# Patient Record
Sex: Male | Born: 1985 | Race: Black or African American | Hispanic: No | Marital: Single | Smoking: Current every day smoker
Health system: Southern US, Community
[De-identification: ages and names within clinical notes are randomized; demographics above are authoritative.]

## PROBLEM LIST (undated history)

## (undated) DIAGNOSIS — S62309A Unspecified fracture of unspecified metacarpal bone, initial encounter for closed fracture: Secondary | ICD-10-CM

## (undated) HISTORY — PX: NO PAST SURGERIES: SHX2092

---
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
## 2006-10-23 ENCOUNTER — Emergency Department (HOSPITAL_COMMUNITY): Admission: EM | Admit: 2006-10-23 | Discharge: 2006-10-23 | Payer: Self-pay | Admitting: *Deleted

## 2012-05-13 ENCOUNTER — Emergency Department: Payer: Self-pay | Admitting: *Deleted

## 2015-02-09 ENCOUNTER — Emergency Department: Payer: Self-pay | Admitting: Emergency Medicine

## 2015-04-11 ENCOUNTER — Encounter (HOSPITAL_COMMUNITY): Payer: Self-pay | Admitting: Emergency Medicine

## 2015-04-11 ENCOUNTER — Emergency Department (HOSPITAL_COMMUNITY)

## 2015-04-11 ENCOUNTER — Emergency Department (HOSPITAL_COMMUNITY)
Admission: EM | Admit: 2015-04-11 | Discharge: 2015-04-11 | Disposition: A | Discharge status: Home / Self Care | Attending: Emergency Medicine | Admitting: Emergency Medicine

## 2015-04-11 DIAGNOSIS — Y939 Activity, unspecified: Secondary | ICD-10-CM | POA: Diagnosis not present

## 2015-04-11 DIAGNOSIS — X58XXXA Exposure to other specified factors, initial encounter: Secondary | ICD-10-CM | POA: Diagnosis not present

## 2015-04-11 DIAGNOSIS — Y929 Unspecified place or not applicable: Secondary | ICD-10-CM | POA: Insufficient documentation

## 2015-04-11 DIAGNOSIS — S6991XA Unspecified injury of right wrist, hand and finger(s), initial encounter: Secondary | ICD-10-CM | POA: Diagnosis not present

## 2015-04-11 DIAGNOSIS — Y999 Unspecified external cause status: Secondary | ICD-10-CM | POA: Insufficient documentation

## 2015-04-11 DIAGNOSIS — M79641 Pain in right hand: Secondary | ICD-10-CM

## 2015-04-11 DIAGNOSIS — Z87891 Personal history of nicotine dependence: Secondary | ICD-10-CM | POA: Diagnosis not present

## 2015-04-11 MED ORDER — ACETAMINOPHEN-CODEINE #3 300-30 MG PO TABS
1.0000 | ORAL_TABLET | Freq: Once | ORAL | Status: AC
  Administered 2015-04-11: 1 via ORAL
  Filled 2015-04-11: qty 1
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
## 2015-04-11 NOTE — Discharge Instructions (Signed)
X-ray today showed old fracture with improper healing, no new injuries noted today. Follow-up with hand surgeon-- call to make appt. Return here as needed for new concerns.

## 2016-05-12 ENCOUNTER — Emergency Department (HOSPITAL_COMMUNITY): Payer: Self-pay

## 2016-05-12 ENCOUNTER — Encounter (HOSPITAL_COMMUNITY): Payer: Self-pay | Admitting: Emergency Medicine

## 2016-05-12 ENCOUNTER — Emergency Department (HOSPITAL_COMMUNITY)
Admission: EM | Admit: 2016-05-12 | Discharge: 2016-05-12 | Disposition: A | Discharge status: Home / Self Care | Payer: Self-pay | Attending: Emergency Medicine | Admitting: Emergency Medicine

## 2016-05-12 DIAGNOSIS — Y999 Unspecified external cause status: Secondary | ICD-10-CM | POA: Insufficient documentation

## 2016-05-12 DIAGNOSIS — Y939 Activity, unspecified: Secondary | ICD-10-CM | POA: Insufficient documentation

## 2016-05-12 DIAGNOSIS — Z87891 Personal history of nicotine dependence: Secondary | ICD-10-CM | POA: Insufficient documentation

## 2016-05-12 DIAGNOSIS — Y92149 Unspecified place in prison as the place of occurrence of the external cause: Secondary | ICD-10-CM | POA: Insufficient documentation

## 2016-05-12 DIAGNOSIS — S62309A Unspecified fracture of unspecified metacarpal bone, initial encounter for closed fracture: Secondary | ICD-10-CM

## 2016-05-12 DIAGNOSIS — S62306A Unspecified fracture of fifth metacarpal bone, right hand, initial encounter for closed fracture: Secondary | ICD-10-CM | POA: Insufficient documentation

## 2016-05-12 HISTORY — DX: Unspecified fracture of unspecified metacarpal bone, initial encounter for closed fracture: S62.309A

## 2016-05-12 MED ORDER — IBUPROFEN 800 MG PO TABS
800.0000 mg | ORAL_TABLET | Freq: Once | ORAL | Status: AC
  Administered 2016-05-12: 800 mg via ORAL
  Filled 2016-05-12: qty 1
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
## 2016-05-12 NOTE — Discharge Instructions (Signed)
Schedule an appointment with the orthopedic hand surgeon, Dr. Merlyn LotKuzma, or the orthopedic surgeon designated by your correctional facility. Continue to ice the hand and use ibuprofen or tylenol for pain control.    Metacarpal Fracture A metacarpal fracture is a break (fracture) of a bone in the hand. Metacarpals are the bones that extend from your knuckles to your wrist. In each hand, you have five metacarpal bones that connect your fingers and your thumb to your wrist. Some hand fractures have bone pieces that are close together and stable (simple). These fractures may be treated with only a splint or cast. Hand fractures that have many pieces of broken bone (comminuted), unstable bone pieces (displaced), or a bone that breaks through the skin (compound) usually require surgery. CAUSES This injury may be caused by:  A fall.  A hard, direct hit to your hand.  An injury that squeezes your knuckle, stretches your finger out of place, or crushes your hand. RISK FACTORS This injury is more likely to occur if:  You play contact sports.  You have certain bone diseases. SYMPTOMS  Symptoms of this type of fracture develop soon after the injury. Symptoms may include:  Swelling.  Pain.  Stiffness.  Increased pain with movement.  Bruising.  Inability to move a finger.  A shortened finger.  A finger knuckle that looks sunken in.  Unusual appearance of the hand or finger (deformity). DIAGNOSIS  This injury may be diagnosed based on your signs and symptoms, especially if you had a recent hand injury. Your health care provider will perform a physical exam. He or she may also order X-rays to confirm the diagnosis.  TREATMENT  Treatment for this injury depends on the type of fracture you have and how severe it is. Possible treatments include:  Non-reduction. This can be done if the bone does not need to be moved back into place. The fracture can be casted or splinted as it is.   Closed  reduction. If your bone is stable and can be moved back into place, you may only need to wear a cast or splint or have buddy taping.  Closed reduction with internal fixation (CRIF). This is the most common treatment. You may have this procedure if your bone can be moved back into place but needs more support. Wires, pins, or screws may be inserted through your skin to stabilize the fracture.  Open reduction with internal fixation (ORIF). This may be needed if your fracture is severe and unstable. It involves surgery to move your bone back into the right position. Screws, wires, or plates are used to stabilize the fracture. After all procedures, you may need to wear a cast or a splint for several weeks. You will also need to have follow-up X-rays to make sure that the bone is healing well and staying in position. After you no longer need your cast or splint, you may need physical therapy. This will help you to regain full movement and strength in your hand.  HOME CARE INSTRUCTIONS  If You Have a Cast:  Do not stick anything inside the cast to scratch your skin. Doing that increases your risk of infection.  Check the skin around the cast every day. Report any concerns to your health care provider. You may put lotion on dry skin around the edges of the cast. Do not apply lotion to the skin underneath the cast. If You Have a Splint:  Wear it as directed by your health care provider. Remove it  only as directed by your health care provider.  Loosen the splint if your fingers become numb and tingle, or if they turn cold and blue. Bathing  Cover the cast or splint with a watertight plastic bag to protect it from water while you take a bath or a shower. Do not let the cast or splint get wet. Managing Pain, Stiffness, and Swelling  If directed, apply ice to the injured area (if you have a splint, not a cast):  Put ice in a plastic bag.  Place a towel between your skin and the bag.  Leave the ice on  for 20 minutes, 2-3 times a day.  Move your fingers often to avoid stiffness and to lessen swelling.  Raise the injured area above the level of your heart while you are sitting or lying down. Driving  Do not drive or operate heavy machinery while taking pain medicine.  Do not drive while wearing a cast or splint on a hand that you use for driving. Activity  Return to your normal activities as directed by your health care provider. Ask your health care provider what activities are safe for you. General Instructions  Do not put pressure on any part of the cast or splint until it is fully hardened. This may take several hours.  Keep the cast or splint clean and dry.  Do not use any tobacco products, including cigarettes, chewing tobacco, or electronic cigarettes. Tobacco can delay bone healing. If you need help quitting, ask your health care provider.  Take medicines only as directed by your health care provider.  Keep all follow-up visits as directed by your health care provider. This is important. SEEK MEDICAL CARE IF:   Your pain is getting worse.  You have redness, swelling, or pain in the injured area.   You have fluid, blood, or pus coming from under your cast or splint.   You notice a bad smell coming from under your cast or splint.   You have a fever.  SEEK IMMEDIATE MEDICAL CARE IF:   You develop a rash.   You have trouble breathing.   Your skin or nails on your injured hand turn blue or gray even after you loosen your splint.  Your injured hand feels cold or becomes numb even after you loosen your splint.   You develop severe pain under the cast or in your hand.   This information is not intended to replace advice given to you by your health care provider. Make sure you discuss any questions you have with your health care provider.   Document Released: 12/03/2005 Document Revised: 08/24/2015 Document Reviewed: 09/22/2014 Elsevier Interactive Patient  Education [employer]
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
## 2016-05-15 ENCOUNTER — Telehealth (HOSPITAL_BASED_OUTPATIENT_CLINIC_OR_DEPARTMENT_OTHER): Payer: Self-pay | Admitting: Emergency Medicine

## 2016-05-22 ENCOUNTER — Other Ambulatory Visit: Payer: Self-pay | Admitting: Orthopedic Surgery

## 2016-05-24 ENCOUNTER — Encounter (HOSPITAL_BASED_OUTPATIENT_CLINIC_OR_DEPARTMENT_OTHER): Payer: Self-pay | Admitting: *Deleted

## 2016-05-24 HISTORY — DX: Imprisonment and other incarceration: Z65.1

## 2016-05-31 ENCOUNTER — Ambulatory Visit (HOSPITAL_BASED_OUTPATIENT_CLINIC_OR_DEPARTMENT_OTHER): Admitting: Certified Registered"

## 2016-05-31 ENCOUNTER — Encounter (HOSPITAL_BASED_OUTPATIENT_CLINIC_OR_DEPARTMENT_OTHER): Payer: Self-pay | Admitting: *Deleted

## 2016-05-31 ENCOUNTER — Ambulatory Visit (HOSPITAL_BASED_OUTPATIENT_CLINIC_OR_DEPARTMENT_OTHER)
Admission: RE | Admit: 2016-05-31 | Discharge: 2016-05-31 | Disposition: A | Discharge status: Home / Self Care | Source: Ambulatory Visit | Attending: Orthopedic Surgery | Admitting: Orthopedic Surgery

## 2016-05-31 ENCOUNTER — Encounter (HOSPITAL_BASED_OUTPATIENT_CLINIC_OR_DEPARTMENT_OTHER)
Admission: RE | Disposition: A | Discharge status: Home / Self Care | Payer: Self-pay | Source: Ambulatory Visit | Attending: Orthopedic Surgery

## 2016-05-31 DIAGNOSIS — F17210 Nicotine dependence, cigarettes, uncomplicated: Secondary | ICD-10-CM | POA: Insufficient documentation

## 2016-05-31 DIAGNOSIS — S62326A Displaced fracture of shaft of fifth metacarpal bone, right hand, initial encounter for closed fracture: Secondary | ICD-10-CM | POA: Insufficient documentation

## 2016-05-31 DIAGNOSIS — X58XXXA Exposure to other specified factors, initial encounter: Secondary | ICD-10-CM | POA: Insufficient documentation

## 2016-05-31 DIAGNOSIS — S62306A Unspecified fracture of fifth metacarpal bone, right hand, initial encounter for closed fracture: Secondary | ICD-10-CM | POA: Diagnosis present

## 2016-05-31 HISTORY — DX: Imprisonment and other incarceration: Z65.1

## 2016-05-31 HISTORY — PX: OPEN REDUCTION INTERNAL FIXATION (ORIF) FINGER WITH RADIAL BONE GRAFT: SHX5666

## 2016-05-31 HISTORY — DX: Unspecified fracture of unspecified metacarpal bone, initial encounter for closed fracture: S62.309A

## 2016-05-31 SURGERY — OPEN REDUCTION INTERNAL FIXATION (ORIF) FINGER WITH RADIAL BONE GRAFT
Anesthesia: Regional | Site: Hand | Laterality: Right

## 2016-05-31 MED ORDER — OXYCODONE HCL 5 MG/5ML PO SOLN: 5.0000 mg | Freq: Once | ORAL | Status: DC | PRN

## 2016-05-31 MED ORDER — ONDANSETRON HCL 4 MG/2ML IJ SOLN
INTRAMUSCULAR | Status: DC | PRN
  Administered 2016-05-31: 4 mg via INTRAVENOUS

## 2016-05-31 MED ORDER — MIDAZOLAM HCL 2 MG/2ML IJ SOLN
1.0000 mg | INTRAMUSCULAR | Status: DC | PRN
  Administered 2016-05-31: 2 mg via INTRAVENOUS

## 2016-05-31 MED ORDER — PROPOFOL 10 MG/ML IV BOLUS
INTRAVENOUS | Status: DC | PRN
  Administered 2016-05-31: 200 mg via INTRAVENOUS

## 2016-05-31 MED ORDER — SCOPOLAMINE 1 MG/3DAYS TD PT72: 1.0000 | MEDICATED_PATCH | Freq: Once | TRANSDERMAL | Status: DC | PRN

## 2016-05-31 MED ORDER — ALBUTEROL SULFATE HFA 108 (90 BASE) MCG/ACT IN AERS
INHALATION_SPRAY | RESPIRATORY_TRACT | Status: AC
  Filled 2016-05-31: qty 6.7

## 2016-05-31 MED ORDER — CEFAZOLIN SODIUM-DEXTROSE 2-4 GM/100ML-% IV SOLN
2.0000 g | INTRAVENOUS | Status: AC
Start: 2016-06-01 — End: 2016-05-31
  Administered 2016-05-31: 2 g via INTRAVENOUS

## 2016-05-31 MED ORDER — LIDOCAINE 2% (20 MG/ML) 5 ML SYRINGE
INTRAMUSCULAR | Status: AC
  Filled 2016-05-31: qty 5

## 2016-05-31 MED ORDER — OXYCODONE HCL 5 MG PO TABS: 5.0000 mg | ORAL_TABLET | Freq: Once | ORAL | Status: DC | PRN

## 2016-05-31 MED ORDER — LIDOCAINE 2% (20 MG/ML) 5 ML SYRINGE
INTRAMUSCULAR | Status: DC | PRN
  Administered 2016-05-31: 100 mg via INTRAVENOUS

## 2016-05-31 MED ORDER — BUPIVACAINE-EPINEPHRINE (PF) 0.5% -1:200000 IJ SOLN
INTRAMUSCULAR | Status: DC | PRN
  Administered 2016-05-31: 30 mL via PERINEURAL

## 2016-05-31 MED ORDER — OXYCODONE-ACETAMINOPHEN 5-325 MG PO TABS: ORAL_TABLET | ORAL | Status: DC

## 2016-05-31 MED ORDER — FENTANYL CITRATE (PF) 100 MCG/2ML IJ SOLN
50.0000 ug | INTRAMUSCULAR | Status: DC | PRN
  Administered 2016-05-31: 100 ug via INTRAVENOUS

## 2016-05-31 MED ORDER — PROPOFOL 10 MG/ML IV BOLUS
INTRAVENOUS | Status: AC
  Filled 2016-05-31: qty 20

## 2016-05-31 MED ORDER — CEFAZOLIN SODIUM-DEXTROSE 2-4 GM/100ML-% IV SOLN
INTRAVENOUS | Status: AC
  Filled 2016-05-31: qty 100

## 2016-05-31 MED ORDER — BUPIVACAINE-EPINEPHRINE (PF) 0.5% -1:200000 IJ SOLN
INTRAMUSCULAR | Status: AC
  Filled 2016-05-31: qty 30

## 2016-05-31 MED ORDER — DEXAMETHASONE SODIUM PHOSPHATE 10 MG/ML IJ SOLN
INTRAMUSCULAR | Status: AC
  Filled 2016-05-31: qty 1

## 2016-05-31 MED ORDER — MEPERIDINE HCL 25 MG/ML IJ SOLN: 6.2500 mg | INTRAMUSCULAR | Status: DC | PRN

## 2016-05-31 MED ORDER — FENTANYL CITRATE (PF) 100 MCG/2ML IJ SOLN
INTRAMUSCULAR | Status: AC
  Filled 2016-05-31: qty 2

## 2016-05-31 MED ORDER — GLYCOPYRROLATE 0.2 MG/ML IJ SOLN: 0.2000 mg | Freq: Once | INTRAMUSCULAR | Status: DC | PRN

## 2016-05-31 MED ORDER — DEXAMETHASONE SODIUM PHOSPHATE 10 MG/ML IJ SOLN
INTRAMUSCULAR | Status: DC | PRN
  Administered 2016-05-31: 10 mg via INTRAVENOUS

## 2016-05-31 MED ORDER — HYDROMORPHONE HCL 1 MG/ML IJ SOLN: 0.2500 mg | INTRAMUSCULAR | Status: DC | PRN

## 2016-05-31 MED ORDER — LACTATED RINGERS IV SOLN
INTRAVENOUS | Status: DC
  Administered 2016-05-31 (×2): via INTRAVENOUS

## 2016-05-31 MED ORDER — CHLORHEXIDINE GLUCONATE 4 % EX LIQD: 60.0000 mL | Freq: Once | CUTANEOUS | Status: DC

## 2016-05-31 MED ORDER — ONDANSETRON HCL 4 MG/2ML IJ SOLN
INTRAMUSCULAR | Status: AC
  Filled 2016-05-31: qty 2

## 2016-05-31 MED ORDER — MIDAZOLAM HCL 2 MG/2ML IJ SOLN
INTRAMUSCULAR | Status: AC
  Filled 2016-05-31: qty 2

## 2016-05-31 SURGICAL SUPPLY — 65 items
BANDAGE ACE 3X5.8 VEL STRL LF (GAUZE/BANDAGES/DRESSINGS) ×2 IMPLANT
BIT DRILL 1.1 (BIT) ×2
BIT DRILL 1.1MM (BIT) ×1
BIT DRILL 60X20X1.1XQC TMX (BIT) IMPLANT
BIT DRL 60X20X1.1XQC TMX (BIT) ×1
BLADE MINI RND TIP GREEN BEAV (BLADE) ×2 IMPLANT
BLADE SURG 15 STRL LF DISP TIS (BLADE) ×2 IMPLANT
BLADE SURG 15 STRL SS (BLADE) ×6
BNDG CMPR 9X4 STRL LF SNTH (GAUZE/BANDAGES/DRESSINGS) ×1
BNDG ELASTIC 2X5.8 VLCR STR LF (GAUZE/BANDAGES/DRESSINGS) IMPLANT
BNDG ESMARK 4X9 LF (GAUZE/BANDAGES/DRESSINGS) ×2 IMPLANT
BNDG GAUZE ELAST 4 BULKY (GAUZE/BANDAGES/DRESSINGS) ×3 IMPLANT
CHLORAPREP W/TINT 26ML (MISCELLANEOUS) ×3 IMPLANT
CORDS BIPOLAR (ELECTRODE) ×3 IMPLANT
COVER BACK TABLE 60X90IN (DRAPES) ×3 IMPLANT
COVER MAYO STAND STRL (DRAPES) ×3 IMPLANT
CUFF TOURNIQUET SINGLE 18IN (TOURNIQUET CUFF) ×3 IMPLANT
DRAPE EXTREMITY T 121X128X90 (DRAPE) ×3 IMPLANT
DRAPE OEC MINIVIEW 54X84 (DRAPES) ×2 IMPLANT
DRAPE SURG 17X23 STRL (DRAPES) ×3 IMPLANT
GAUZE SPONGE 4X4 12PLY STRL (GAUZE/BANDAGES/DRESSINGS) ×3 IMPLANT
GAUZE XEROFORM 1X8 LF (GAUZE/BANDAGES/DRESSINGS) ×3 IMPLANT
GLOVE BIO SURGEON STRL SZ7.5 (GLOVE) ×3 IMPLANT
GLOVE BIOGEL PI IND STRL 8 (GLOVE) ×1 IMPLANT
GLOVE BIOGEL PI INDICATOR 8 (GLOVE) ×4
GLOVE SURG SS PI 7.5 STRL IVOR (GLOVE) ×2 IMPLANT
GOWN STRL REUS W/ TWL LRG LVL3 (GOWN DISPOSABLE) ×1 IMPLANT
GOWN STRL REUS W/ TWL XL LVL3 (GOWN DISPOSABLE) ×1 IMPLANT
GOWN STRL REUS W/TWL LRG LVL3 (GOWN DISPOSABLE) ×3
GOWN STRL REUS W/TWL XL LVL3 (GOWN DISPOSABLE) ×9
K-WIRE .045X6 DBL TRO NS (WIRE) ×3
K-WIRE DBL TRONS .035X6 (WIRE) ×3
KWIRE .045X6 DBL TRO NS (WIRE) IMPLANT
KWIRE DBL TRONS .035X6 (WIRE) IMPLANT
NDL HYPO 25X1 1.5 SAFETY (NEEDLE) IMPLANT
NEEDLE HYPO 22GX1.5 SAFETY (NEEDLE) IMPLANT
NEEDLE HYPO 25X1 1.5 SAFETY (NEEDLE) IMPLANT
NS IRRIG 1000ML POUR BTL (IV SOLUTION) ×3 IMPLANT
PACK BASIN DAY SURGERY FS (CUSTOM PROCEDURE TRAY) ×3 IMPLANT
PAD CAST 3X4 CTTN HI CHSV (CAST SUPPLIES) IMPLANT
PAD CAST 4YDX4 CTTN HI CHSV (CAST SUPPLIES) IMPLANT
PADDING CAST ABS 4INX4YD NS (CAST SUPPLIES) ×2
PADDING CAST ABS COTTON 4X4 ST (CAST SUPPLIES) ×1 IMPLANT
PADDING CAST COTTON 3X4 STRL (CAST SUPPLIES)
PADDING CAST COTTON 4X4 STRL (CAST SUPPLIES)
PLATE STRAIGHT LOCK 1.5 (Plate) ×2 IMPLANT
SCREW 1.5X15MM (Screw) ×4 IMPLANT
SCREW 1.5X18MM (Screw) ×9 IMPLANT
SCREW BN 18X1.5XST NONLOCK (Screw) IMPLANT
SCREW LOCKING 1.5X10 (Screw) ×2 IMPLANT
SCREW NONIOC 1.5 14M (Screw) ×4 IMPLANT
SLEEVE SCD COMPRESS KNEE MED (MISCELLANEOUS) ×2 IMPLANT
SPLINT PLASTER CAST XFAST 4X15 (CAST SUPPLIES) IMPLANT
SPLINT PLASTER XTRA FAST SET 4 (CAST SUPPLIES)
STOCKINETTE 4X48 STRL (DRAPES) ×3 IMPLANT
SUT ETHILON 3 0 PS 1 (SUTURE) IMPLANT
SUT ETHILON 4 0 PS 2 18 (SUTURE) ×3 IMPLANT
SUT MERSILENE 4 0 P 3 (SUTURE) IMPLANT
SUT VIC AB 3-0 PS1 18 (SUTURE)
SUT VIC AB 3-0 PS1 18XBRD (SUTURE) IMPLANT
SUT VICRYL 4-0 PS2 18IN ABS (SUTURE) IMPLANT
SYR BULB 3OZ (MISCELLANEOUS) ×3 IMPLANT
SYR CONTROL 10ML LL (SYRINGE) IMPLANT
TOWEL OR 17X24 6PK STRL BLUE (TOWEL DISPOSABLE) ×6 IMPLANT
UNDERPAD 30X30 (UNDERPADS AND DIAPERS) ×3 IMPLANT

## 2016-05-31 NOTE — Discharge Instructions (Addendum)
[facility] Instructions Hand Surgery  Wound Care: Keep your hand elevated above the level of your heart.  Do not allow it to dangle by your side.  Keep the dressing dry and do not remove it unless your doctor advises you to do so.  He will usually change it at the time of your post-op visit.  Moving your fingers is advised to stimulate circulation but will depend on the site of your surgery.  If you have a splint applied, your doctor will advise you regarding movement.  Activity: Do not drive or operate machinery today.  Rest today and then you may return to your normal activity and work as indicated by your physician.  Diet:  Drink liquids today or eat a light diet.  You may resume a regular diet tomorrow.    General expectations: Pain for two to three days. Fingers may become slightly swollen.  Call your doctor if any of the following occur: Severe pain not relieved by pain medication. Elevated temperature. Dressing soaked with blood. Inability to move fingers. White or bluish color to fingers.    Post Anesthesia Home Care Instructions  Activity: Get plenty of rest for the remainder of the day. A responsible adult should stay with you for 24 hours following the procedure.  For the next 24 hours, DO NOT: -Drive a car -Advertising copywriter -Drink alcoholic beverages -Take any medication unless instructed by your physician -Make any legal decisions or sign important papers.  Meals: Start with liquid foods such as gelatin or soup. Progress to regular foods as tolerated. Avoid greasy, spicy, heavy foods. If nausea and/or vomiting occur, drink only clear liquids until the nausea and/or vomiting subsides. Call your physician if vomiting continues.  Special Instructions/Symptoms: Your throat may feel dry or sore from the anesthesia or the breathing tube placed in your throat during surgery. If this causes discomfort, gargle with warm salt water. The discomfort should disappear within  24 hours.  If you had a scopolamine patch placed behind your ear for the management of post- operative nausea and/or vomiting:  1. The medication in the patch is effective for 72 hours, after which it should be removed.  Wrap patch in a tissue and discard in the trash. Wash hands thoroughly with soap and water. 2. You may remove the patch earlier than 72 hours if you experience unpleasant side effects which may include dry mouth, dizziness or visual disturbances. 3. Avoid touching the patch. Wash your hands with soap and water after contact with the patch.    Call your surgeon if you experience:   1.  Fever over 101.0. 2.  Inability to urinate. 3.  Nausea and/or vomiting. 4.  Extreme swelling or bruising at the surgical site. 5.  Continued bleeding from the incision. 6.  Increased pain, redness or drainage from the incision. 7.  Problems related to your pain medication. 8.  Any problems and/or concerns    Post Anesthesia Home Care Instructions  Activity: Get plenty of rest for the remainder of the day. A responsible adult should stay with you for 24 hours following the procedure.  For the next 24 hours, DO NOT: -Drive a car -Advertising copywriter -Drink alcoholic beverages -Take any medication unless instructed by your physician -Make any legal decisions or sign important papers.  Meals: Start with liquid foods such as gelatin or soup. Progress to regular foods as tolerated. Avoid greasy, spicy, heavy foods. If nausea and/or vomiting occur, drink only clear liquids until the nausea and/or  vomiting subsides. Call your physician if vomiting continues.  Special Instructions/Symptoms: Your throat may feel dry or sore from the anesthesia or the breathing tube placed in your throat during surgery. If this causes discomfort, gargle with warm salt water. The discomfort should disappear within 24 hours.  If you had a scopolamine patch placed behind your ear for the management of post-  operative nausea and/or vomiting:  1. The medication in the patch is effective for 72 hours, after which it should be removed.  Wrap patch in a tissue and discard in the trash. Wash hands thoroughly with soap and water. 2. You may remove the patch earlier than 72 hours if you experience unpleasant side effects which may include dry mouth, dizziness or visual disturbances. 3. Avoid touching the patch. Wash your hands with soap and water after contact with the patch.    Regional Anesthesia Blocks  1. Numbness or the inability to move the "blocked" extremity may last from 3-48 hours after placement. The length of time depends on the medication injected and your individual response to the medication. If the numbness is not going away after 48 hours, call your surgeon.  2. The extremity that is blocked will need to be protected until the numbness is gone and the  Strength has returned. Because you cannot feel it, you will need to take extra care to avoid injury. Because it may be weak, you may have difficulty moving it or using it. You may not know what position it is in without looking at it while the block is in effect.  3. For blocks in the legs and feet, returning to weight bearing and walking needs to be done carefully. You will need to wait until the numbness is entirely gone and the strength has returned. You should be able to move your leg and foot normally before you try and bear weight or walk. You will need someone to be with you when you first try to ensure you do not fall and possibly risk injury.  4. Bruising and tenderness at the needle site are common side effects and will resolve in a few days.  5. Persistent numbness or new problems with movement should be communicated to the surgeon or the [facility] [phone])/ [facility] [phone]).  Call your surgeon if you experience:   1.  Fever over 101.0. 2.  Inability to urinate. 3.  Nausea and/or  vomiting. 4.  Extreme swelling or bruising at the surgical site. 5.  Continued bleeding from the incision. 6.  Increased pain, redness or drainage from the incision. 7.  Problems related to your pain medication. 8.  Any problems and/or concerns

## 2016-05-31 NOTE — Op Note (Signed)
[id number] 

## 2016-05-31 NOTE — Anesthesia Procedure Notes (Addendum)
Anesthesia Regional Block:  Infraclavicular brachial plexus block  Pre-Anesthetic Checklist: ,, timeout performed, Correct Patient, Correct Site, Correct Laterality, Correct Procedure, Correct Position, site marked, Risks and benefits discussed,  Surgical consent,  Pre-op evaluation,  At surgeon's request and post-op pain management  Laterality: Right and Upper  Prep: chloraprep       Needles:  Injection technique: Single-shot  Needle Type: Echogenic Stimulator Needle     Needle Length: 5cm 5 cm Needle Gauge: 21 and 21 G    Additional Needles:  Procedures: ultrasound guided (picture in chart) Infraclavicular brachial plexus block Narrative:  Start time: 05/31/2016 12:54 PM End time: 05/31/2016 12:59 PM Injection made incrementally with aspirations every 5 mL.  Performed by: Personally  Anesthesiologist: CREWS, DAVID   Procedure Name: LMA Insertion Date/Time: 05/31/2016 2:20 PM Performed by: Curly ShoresRAFT, Hosea Hanawalt W Pre-anesthesia Checklist: Patient identified, Emergency Drugs available, Suction available and Patient being monitored Patient Re-evaluated:Patient Re-evaluated prior to inductionOxygen Delivery Method: Circle system utilized Preoxygenation: Pre-oxygenation with 100% oxygen Intubation Type: IV induction Ventilation: Mask ventilation without difficulty LMA: LMA inserted LMA Size: 4.0 Number of attempts: 1 Airway Equipment and Method: Bite block Placement Confirmation: positive ETCO2 and breath sounds checked- equal and bilateral Tube secured with: Tape Dental Injury: Teeth and Oropharynx as per pre-operative assessment       Right Infraclavicular block image

## 2016-05-31 NOTE — Transfer of Care (Signed)
Immediate Anesthesia Transfer of Care Note  Patient: Jeffrey Mccoy  Procedure(s) Performed: Procedure(s): RIGHT SMALL METACARPAL FRACTURE OPEN REDUCTION INTERNAL FIXATION (ORIF) DISTAL RADIUS BONE GRAFT  (Right)  Patient Location: PACU  Anesthesia Type:GA combined with regional for post-op pain  Level of Consciousness: awake, sedated and responds to stimulation  Airway & Oxygen Therapy: Patient Spontanous Breathing and Patient connected to face mask oxygen  Post-op Assessment: Report given to RN, Post -op Vital signs reviewed and stable and Patient moving all extremities  Post vital signs: Reviewed and stable  Last Vitals:  Filed Vitals:   05/31/16 1310 05/31/16 1606  BP:    Pulse: 76 74  Temp:    Resp: 17 20    Last Pain:  Filed Vitals:   05/31/16 1607  PainSc: 2          Complications: No apparent anesthesia complications

## 2016-05-31 NOTE — Brief Op Note (Signed)
05/31/2016  4:11 PM  PATIENT:  Gevena Martharles D Lentz  30 y.o. male  PRE-OPERATIVE DIAGNOSIS:  RIGHT SMALL FINGER METACARPAL FRACTURE   POST-OPERATIVE DIAGNOSIS:  RIGHT SMALL FINGER METACARPAL FRACTURE   PROCEDURE:  Procedure(s): RIGHT SMALL METACARPAL FRACTURE OPEN REDUCTION INTERNAL FIXATION (ORIF) DISTAL RADIUS BONE GRAFT  (Right)  SURGEON:  Surgeon(s) and Role:    * Betha LoaKevin Shenita Trego, MD - Primary    * Cindee SaltGary Mohini Heathcock, MD - Assisting  PHYSICIAN ASSISTANT:   ASSISTANTS: Cindee SaltGary Aysia Lowder, MD   ANESTHESIA:   regional and general  EBL:  Total I/O In: 1600 [I.V.:1600] Out: 2 [Blood:2]  BLOOD ADMINISTERED:none  DRAINS: none   LOCAL MEDICATIONS USED:  NONE  SPECIMEN:  No Specimen  DISPOSITION OF SPECIMEN:  N/A  COUNTS:  YES  TOURNIQUET:   Total Tourniquet Time Documented: Upper Arm (Right) - 88 minutes Total: Upper Arm (Right) - 88 minutes   DICTATION: .Other Dictation: Dictation Number [id number]  PLAN OF CARE: Discharge to home after PACU  PATIENT DISPOSITION:  PACU - hemodynamically stable.

## 2016-05-31 NOTE — Op Note (Signed)
Intra-operative fluoroscopic images in the AP, lateral, and oblique views were taken and evaluated by myself.  Reduction and hardware placement were confirmed.  There was no intraarticular penetration of permanent hardware.  

## 2016-05-31 NOTE — Progress Notes (Signed)
Assisted Dr. Crews with right, ultrasound guided, infraclavicular block. Side rails up, monitors on throughout procedure. See vital signs in flow sheet. Tolerated Procedure well. 

## 2016-05-31 NOTE — Op Note (Signed)
I assisted Surgeon(s) and Role:    * Betha LoaKevin Haylo Fake, MD - Primary    * Cindee SaltGary Myrna Vonseggern, MD - Assisting on the Procedure(s): RIGHT SMALL METACARPAL FRACTURE OPEN REDUCTION INTERNAL FIXATION (ORIF) DISTAL RADIUS BONE GRAFT  on 05/31/2016.  I provided assistance on this case as follows: the approach, retraction, opening and debridement sharply of the non-union site, reduction of the non-union, stabilization of the fracture and application of the plate and screws, harvesting and insertion of the auto graft, closure of the wounds and application of the dressings and splints. I was present for the entire case.  Electronically signed by: Nicki ReaperKUZMA,Leiby Pigeon R, MD Date: 05/31/2016 Time: 4:01 PM

## 2016-05-31 NOTE — Anesthesia Postprocedure Evaluation (Deleted)
Anesthesia Post Note  Patient: Gevena Martharles D Assefa  Procedure(s) Performed: Procedure(s) (LRB): RIGHT SMALL METACARPAL FRACTURE OPEN REDUCTION INTERNAL FIXATION (ORIF) DISTAL RADIUS BONE GRAFT  (Right)  Patient location during evaluation: PACU Anesthesia Type: MAC Level of consciousness: awake and alert Pain management: pain level controlled Vital Signs Assessment: post-procedure vital signs reviewed and stable Respiratory status: spontaneous breathing, nonlabored ventilation, respiratory function stable and patient connected to nasal cannula oxygen Cardiovascular status: stable and blood pressure returned to baseline Anesthetic complications: no    Last Vitals:  Filed Vitals:   05/31/16 1630 05/31/16 1645  BP: 137/91 129/89  Pulse: 93 84  Temp:  37.1 C  Resp: 24 18    Last Pain:  Filed Vitals:   05/31/16 1652  PainSc: 0-No pain                 Cecile HearingStephen Edward Turk

## 2016-05-31 NOTE — Addendum Note (Signed)
Addendum  created 05/31/16 1755 by Cecile HearingStephen Edward Jowan Skillin, MD   Modules edited: Clinical Notes, Notes Section   Clinical Notes:  File: [id number]   Notes Section:  Delete: [id number]
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
## 2016-05-31 NOTE — Anesthesia Postprocedure Evaluation (Signed)
Anesthesia Post Note  Patient: Jeffrey Mccoy  Procedure(s) Performed: Procedure(s) (LRB): RIGHT SMALL METACARPAL FRACTURE OPEN REDUCTION INTERNAL FIXATION (ORIF) DISTAL RADIUS BONE GRAFT  (Right)  Patient location during evaluation: PACU Anesthesia Type: General and Regional Level of consciousness: awake and alert Pain management: pain level controlled Vital Signs Assessment: post-procedure vital signs reviewed and stable Respiratory status: spontaneous breathing, nonlabored ventilation, respiratory function stable and patient connected to nasal cannula oxygen Cardiovascular status: blood pressure returned to baseline and stable Postop Assessment: no signs of nausea or vomiting Anesthetic complications: no    Last Vitals:  Filed Vitals:   05/31/16 1630 05/31/16 1645  BP: 137/91 129/89  Pulse: 93 84  Temp:  37.1 C  Resp: 24 18    Last Pain:  Filed Vitals:   05/31/16 1652  PainSc: 0-No pain                 Cecile HearingStephen Edward Turk

## 2016-05-31 NOTE — Anesthesia Preprocedure Evaluation (Signed)
Anesthesia Evaluation  Patient identified by MRN, date of birth, ID band Patient awake    Reviewed: Allergy & Precautions, NPO status , Patient's Chart, lab work & pertinent test results  Airway Mallampati: I  TM Distance: >3 FB Neck ROM: Full    Dental  (+) Teeth Intact, Dental Advisory Given   Pulmonary Current Smoker,    breath sounds clear to auscultation       Cardiovascular  Rhythm:Regular Rate:Normal     Neuro/Psych    GI/Hepatic   Endo/Other    Renal/GU      Musculoskeletal   Abdominal   Peds  Hematology   Anesthesia Other Findings   Reproductive/Obstetrics                            Anesthesia Physical Anesthesia Plan  ASA: I  Anesthesia Plan: General   Post-op Pain Management: GA combined w/ Regional for post-op pain   Induction: Intravenous  Airway Management Planned: LMA  Additional Equipment:   Intra-op Plan:   Post-operative Plan: Extubation in OR  Informed Consent: I have reviewed the patients History and Physical, chart, labs and discussed the procedure including the risks, benefits and alternatives for the proposed anesthesia with the patient or authorized representative who has indicated his/her understanding and acceptance.   Dental advisory given  Plan Discussed with: CRNA, Anesthesiologist and Surgeon  Anesthesia Plan Comments:         Anesthesia Quick Evaluation  

## 2016-06-01 ENCOUNTER — Encounter (HOSPITAL_BASED_OUTPATIENT_CLINIC_OR_DEPARTMENT_OTHER): Payer: Self-pay | Admitting: Orthopedic Surgery

## 2018-12-07 ENCOUNTER — Encounter (HOSPITAL_COMMUNITY): Payer: Self-pay

## 2018-12-07 ENCOUNTER — Emergency Department (HOSPITAL_COMMUNITY)
Admission: EM | Admit: 2018-12-07 | Discharge: 2018-12-07 | Disposition: A | Discharge status: Home / Self Care | Attending: Emergency Medicine | Admitting: Emergency Medicine

## 2018-12-07 ENCOUNTER — Other Ambulatory Visit: Payer: Self-pay

## 2018-12-07 ENCOUNTER — Emergency Department (HOSPITAL_COMMUNITY)

## 2018-12-07 DIAGNOSIS — Z79899 Other long term (current) drug therapy: Secondary | ICD-10-CM | POA: Insufficient documentation

## 2018-12-07 DIAGNOSIS — J181 Lobar pneumonia, unspecified organism: Secondary | ICD-10-CM | POA: Insufficient documentation

## 2018-12-07 DIAGNOSIS — R059 Cough, unspecified: Secondary | ICD-10-CM

## 2018-12-07 DIAGNOSIS — R05 Cough: Secondary | ICD-10-CM

## 2018-12-07 DIAGNOSIS — J189 Pneumonia, unspecified organism: Secondary | ICD-10-CM

## 2018-12-07 DIAGNOSIS — F17210 Nicotine dependence, cigarettes, uncomplicated: Secondary | ICD-10-CM | POA: Insufficient documentation

## 2018-12-07 MED ORDER — DOXYCYCLINE HYCLATE 100 MG PO CAPS: 100.0000 mg | ORAL_CAPSULE | Freq: Two times a day (BID) | ORAL | 0 refills | Status: AC

## 2018-12-07 MED ORDER — DOXYCYCLINE HYCLATE 100 MG PO TABS
100.0000 mg | ORAL_TABLET | Freq: Once | ORAL | Status: AC
  Administered 2018-12-07: 100 mg via ORAL
  Filled 2018-12-07: qty 1

## 2018-12-07 MED ORDER — AEROCHAMBER PLUS FLO-VU MEDIUM MISC
1.0000 | Freq: Once | Status: AC
  Administered 2018-12-07: 1
  Filled 2018-12-07: qty 1

## 2018-12-07 MED ORDER — ALBUTEROL SULFATE HFA 108 (90 BASE) MCG/ACT IN AERS
1.0000 | INHALATION_SPRAY | Freq: Once | RESPIRATORY_TRACT | Status: AC
  Administered 2018-12-07: 1 via RESPIRATORY_TRACT
  Filled 2018-12-07: qty 6.7

## 2018-12-07 NOTE — Discharge Instructions (Signed)
Please see the information and instructions below regarding your visit.  Your diagnoses today include:  1. Community acquired pneumonia of left lower lobe of lung (HCC)   2. Cough     Tests performed today include: See side panel of your discharge paperwork for testing performed today. Vital signs are listed at the bottom of these instructions.   Your chest x-ray is suggestive of an early pneumonia on the left side.  Medications prescribed:    Take any prescribed medications only as prescribed, and any over the counter medications only as directed on the packaging.  Doxycycline is an antibiotic that fights infection in the lung. This medication can make your skin sensitive to the sun, so please ensure that you wear sunscreen, hats, or other coverage over your skin while taking this. This medicine CANNOT be taken by women while pregnant, breastfeeding, or trying to become pregnant.  Please speak with a healthcare provider if any of these situations apply to you.  Please use the inhaler, 1 to 2 puffs every 4-6 hours for the next 5 days.  Home care instructions:  Please follow any educational materials contained in this packet.   Follow-up instructions: Please follow-up with your primary care provider within the next week for further evaluation of your symptoms if they are not completely improved.    Return instructions:  Please return to the Emergency Department if you experience worsening symptoms.  Please return to the ED for any non-resolving fevers, worsening chest pain, shortness of breath, lightheadedness, feeling or going to pass out. Please return if you have any other emergent concerns.  Additional Information:   Your vital signs today were: BP 116/67    Pulse 72    Temp 97.9 F (36.6 C) (Oral)    Ht 5\' 10"  (1.778 m)    Wt 77.1 kg    SpO2 95%    BMI 24.39 kg/m  If your blood pressure (BP) was elevated on multiple readings during this visit above 130 for the top number or  above 80 for the bottom number, please have this repeated by your primary care provider within one month. --------------  Thank you for allowing us to participate in your care today.
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
## 2018-12-07 NOTE — ED Provider Notes (Signed)
[facility] EMERGENCY DEPARTMENT Provider Note   CSN: [id number] Arrival date & time: 12/07/18  [id number]     History   Chief Complaint Chief Complaint  Patient presents with  . flu like symptoms    HPI Jeffrey Mccoy is a 32 y.o. male.  HPI  Patient is a 32 year old male with no significant past medical history, but is a smoker presenting for headache, congestion, and nonproductive cough.  Patient reports symptoms again 4 days ago.  He reports that he had a fever of 101.4 when he was [facility] the plasma [facility] to give plasma.  He reports that he has not checked his temperature again, but has been taking Tylenol to reduce the fever.  Reports he had a poor appetite, sharp frontal headache intermittently, and pain in bilateral ears.  He also reports that he feels there is phlegm "caught" in his chest, he cannot cough it up.  Patient denies any vision disturbance or neurologic symptoms.  Patient denies any sore throat.  Patient denies any history of chronic lung disease, or immune compromise status.  Patient did have a recent sick contact in his friend.  Past Medical History:  Diagnosis Date  . Incarceration 05/24/2016   [place] he will be accompanied by deputies DOS  . Metacarpal bone fracture 05/12/2016   right small    There are no active problems to display for this patient.   Past Surgical History:  Procedure Laterality Date  . NO PAST SURGERIES    . OPEN REDUCTION INTERNAL FIXATION (ORIF) FINGER WITH RADIAL BONE GRAFT Right 05/31/2016   Procedure: RIGHT SMALL METACARPAL FRACTURE OPEN REDUCTION INTERNAL FIXATION (ORIF) DISTAL RADIUS BONE GRAFT ;  Surgeon: Betha LoaKevin Kuzma, MD;  Location: [facility];  Service: Orthopedics;  Laterality: Right;        Home Medications    Prior to Admission medications   Medication Sig Start Date End Date Taking? Authorizing Provider  acetaminophen (TYLENOL) 325 MG tablet Take 650  mg by mouth every 6 (six) hours as needed.    [provider]  oxyCODONE-acetaminophen (PERCOCET) 5-325 MG tablet 1-2 tabs po q6 hours prn pain 05/31/16   Betha LoaKuzma, Kevin, MD    Family History History reviewed. No pertinent family history.  Social History Social History   Tobacco Use  . Smoking status: Current Every Day Smoker    Packs/day: 0.50    Years: 15.00    Pack years: 7.50    Types: Cigarettes  . Smokeless tobacco: Never Used  Substance Use Topics  . Alcohol use: Yes    Comment: occasionally  . Drug use: No     Allergies   Patient has no known allergies.   Review of Systems Review of Systems  Constitutional: Positive for appetite change, chills and fever.  HENT: Positive for congestion, ear pain, postnasal drip and rhinorrhea. Negative for sinus pain and sore throat.   Respiratory: Positive for cough.   Cardiovascular: Negative for chest pain and palpitations.  All other systems reviewed and are negative.    Physical Exam Updated Vital Signs BP 116/67   Pulse 72   Temp 97.9 F (36.6 C) (Oral)   Ht 5\' 10"  (1.778 m)   Wt 77.1 kg   SpO2 95%   BMI 24.39 kg/m   Physical Exam Vitals signs and nursing note reviewed.  Constitutional:      General: He is not in acute distress.    Appearance: He is  well-developed. He is not diaphoretic.     Comments: Sitting comfortably in bed.  HENT:     Head: Normocephalic and atraumatic.  Eyes:     General:        Right eye: No discharge.        Left eye: No discharge.     Conjunctiva/sclera: Conjunctivae normal.     Comments: EOMs normal to gross examination.  Neck:     Musculoskeletal: Normal range of motion.  Cardiovascular:     Rate and Rhythm: Normal rate and regular rhythm.     Heart sounds: Normal heart sounds.  Pulmonary:     Effort: Pulmonary effort is normal.     Breath sounds: Wheezing and rhonchi present.     Comments: Soft end expiratory wheezes in bilateral upper lung field.  Rhonchi present  bilaterally that clear with cough. Abdominal:     General: There is no distension.  Musculoskeletal: Normal range of motion.  Skin:    General: Skin is warm and dry.  Neurological:     Mental Status: He is alert.     Comments: Cranial nerves intact to gross observation. Patient moves extremities without difficulty.  Psychiatric:        Behavior: Behavior normal.        Thought Content: Thought content normal.        Judgment: Judgment normal.      ED Treatments / Results  Labs (all labs ordered are listed, but only abnormal results are displayed) Labs Reviewed - No data to display  EKG None  Radiology No results found.  Procedures Procedures (including critical care time)  Medications Ordered in ED Medications  albuterol (PROVENTIL HFA;VENTOLIN HFA) 108 (90 Base) MCG/ACT inhaler 1 puff (has no administration in time range)  AEROCHAMBER PLUS FLO-VU MEDIUM MISC 1 each (has no administration in time range)     Initial Impression / Assessment and Plan / ED Course  I have reviewed the triage vital signs and the nursing notes.  Pertinent labs & imaging results that were available during my care of the patient were reviewed by me and considered in my medical decision making (see chart for details).     Patient is well-appearing and in no acute distress.  Patient with likely viral bronchitis.  Patient does have soft wheezing, and is a smoker of both cannabis and nicotine.  Will obtain chest x-ray and administer albuterol.  Chest x-ray demonstrated possible early left lower infiltrate.  Clinically correlates.  Will treat with doxycycline.  Return precautions were given to patient for any non-resolving fevers, shortness of breath, chest pain, dizziness, lightheadedness, syncope or presyncope.  Patient is in understanding and agrees with plan of care.  Patient was counseled on smoking cessation.  Final Clinical Impressions(s) / ED Diagnoses   Final diagnoses:  Community  acquired pneumonia of left lower lobe of lung (HCC)  Cough    ED Discharge Orders         Ordered    doxycycline (VIBRAMYCIN) 100 MG capsule  2 times daily     12/07/18 0947           Elisha PonderMurray, Joquan Lotz B, PA-C 12/07/18 0950    Virgina Norfolkuratolo, Adam, DO 12/07/18 1658

## 2020-06-30 ENCOUNTER — Emergency Department (HOSPITAL_COMMUNITY)
Admission: EM | Admit: 2020-06-30 | Discharge: 2020-06-30 | Disposition: A | Discharge status: Home / Self Care | Payer: Self-pay | Attending: Emergency Medicine | Admitting: Emergency Medicine

## 2020-06-30 ENCOUNTER — Other Ambulatory Visit: Payer: Self-pay

## 2020-06-30 ENCOUNTER — Encounter (HOSPITAL_COMMUNITY): Payer: Self-pay | Admitting: Student

## 2020-06-30 DIAGNOSIS — J020 Streptococcal pharyngitis: Secondary | ICD-10-CM

## 2020-06-30 DIAGNOSIS — J029 Acute pharyngitis, unspecified: Secondary | ICD-10-CM | POA: Insufficient documentation

## 2020-06-30 DIAGNOSIS — H9202 Otalgia, left ear: Secondary | ICD-10-CM | POA: Insufficient documentation

## 2020-06-30 LAB — GROUP A STREP BY PCR: Group A Strep by PCR: DETECTED — AB

## 2020-06-30 MED ORDER — DEXAMETHASONE SODIUM PHOSPHATE 10 MG/ML IJ SOLN
10.0000 mg | Freq: Once | INTRAMUSCULAR | Status: DC
  Filled 2020-06-30: qty 1

## 2020-06-30 MED ORDER — PENICILLIN G BENZATHINE 1200000 UNIT/2ML IM SUSP
1.2000 10*6.[IU] | Freq: Once | INTRAMUSCULAR | Status: DC
  Filled 2020-06-30: qty 2

## 2020-06-30 NOTE — ED Triage Notes (Signed)
C/o throat pain x 2 days w/ difficulty swallowing. Concern for tonsillitis. Stated left side more swollen than right;

## 2020-06-30 NOTE — ED Provider Notes (Signed)
[facility] EMERGENCY DEPARTMENT Provider Note   CSN: [id number] Arrival date & time: 06/30/20  1252     History Chief Complaint  Patient presents with  . Sore Throat    Jeffrey Mccoy is a 34 y.o. male with a history of tobacco abuse who presents to the ED with complaints of sore throat x 2 days. Patient states pain is constant, worse with swallowing, but able to swallow, no alleviating factors.  He reports associated left ear pain.  Denies fever, chills, dyspnea, vomiting, cough, or change in voice.  HPI     Past Medical History:  Diagnosis Date  . Incarceration 05/24/2016   pt. is [place] in [employer] [place] [facility]; he will be accompanied by deputies DOS  . Metacarpal bone fracture 05/12/2016   right small    There are no problems to display for this patient.   Past Surgical History:  Procedure Laterality Date  . NO PAST SURGERIES    . OPEN REDUCTION INTERNAL FIXATION (ORIF) FINGER WITH RADIAL BONE GRAFT Right 05/31/2016   Procedure: RIGHT SMALL METACARPAL FRACTURE OPEN REDUCTION INTERNAL FIXATION (ORIF) DISTAL RADIUS BONE GRAFT ;  Surgeon: Betha Loa, MD;  Location: [facility];  Service: Orthopedics;  Laterality: Right;       History reviewed. No pertinent family history.  Social History   Tobacco Use  . Smoking status: Current Every Day Smoker    Packs/day: 0.50    Years: 15.00    Pack years: 7.50    Types: Cigarettes  . Smokeless tobacco: Never Used  Substance Use Topics  . Alcohol use: Yes    Comment: occasionally  . Drug use: No    Home Medications Prior to Admission medications   Medication Sig Start Date End Date Taking? Authorizing Provider  acetaminophen (TYLENOL) 325 MG tablet Take 650 mg by mouth every 6 (six) hours as needed.    [provider]  oxyCODONE-acetaminophen (PERCOCET) 5-325 MG tablet 1-2 tabs po q6 hours prn pain 05/31/16   Betha Loa, MD    Allergies    Patient has no known  allergies.  Review of Systems   Review of Systems  Constitutional: Negative for chills and fever.  HENT: Positive for ear pain, sore throat and trouble swallowing (painful, but able). Negative for congestion, rhinorrhea and voice change.   Respiratory: Negative for cough and shortness of breath.   Cardiovascular: Negative for chest pain and leg swelling.  Gastrointestinal: Negative for nausea and vomiting.  Neurological: Negative for syncope.    Physical Exam Updated Vital Signs BP 122/79 (BP Location: Left Arm)   Pulse 71   Temp 98.9 F (37.2 C) (Oral)   Resp 17   Ht 5\' 10"  (1.778 m)   Wt 79.4 kg   SpO2 100%   BMI 25.11 kg/m   Physical Exam Vitals and nursing note reviewed.  Constitutional:      General: He is not in acute distress.    Appearance: He is well-developed. He is not toxic-appearing.  HENT:     Head: Normocephalic and atraumatic.     Right Ear: Ear canal normal. Tympanic membrane is not perforated, erythematous, retracted or bulging.     Left Ear: Ear canal normal. Tympanic membrane is not perforated, erythematous, retracted or bulging.     Ears:     Comments: No mastoid erythema/swellng/tenderness.     Nose:     Right Sinus: No maxillary sinus tenderness or frontal sinus tenderness.  Left Sinus: No maxillary sinus tenderness or frontal sinus tenderness.     Mouth/Throat:     Pharynx: Uvula midline. Pharyngeal swelling and posterior oropharyngeal erythema present. No uvula swelling.     Tonsils: Tonsillar exudate present. 2+ on the right. 2+ on the left.     Comments: Posterior oropharynx is erythematous with some swelling, tonsils are 2+ erythematous and w/ exudate. overall symmetric appearing. Patient tolerating own secretions without difficulty. No trismus. No drooling. No hot potato voice. No swelling beneath the tongue, submandibular compartment is soft.  Eyes:     General:        Right eye: No discharge.        Left eye: No discharge.      Conjunctiva/sclera: Conjunctivae normal.  Cardiovascular:     Rate and Rhythm: Normal rate and regular rhythm.  Pulmonary:     Effort: Pulmonary effort is normal. No respiratory distress.     Breath sounds: Normal breath sounds. No wheezing, rhonchi or rales.  Abdominal:     General: There is no distension.     Palpations: Abdomen is soft.     Tenderness: There is no abdominal tenderness.  Musculoskeletal:     Cervical back: Neck supple. No rigidity.  Lymphadenopathy:     Cervical: No cervical adenopathy.  Skin:    General: Skin is warm and dry.     Findings: No rash.  Neurological:     Mental Status: He is alert.  Psychiatric:        Behavior: Behavior normal.     ED Results / Procedures / Treatments   Labs (all labs ordered are listed, but only abnormal results are displayed) Labs Reviewed  GROUP A STREP BY PCR - Abnormal; Notable for the following components:      Result Value   Group A Strep by PCR DETECTED (*)    All other components within normal limits    EKG None  Radiology No results found.  Procedures Procedures (including critical care time)  Medications Ordered in ED Medications  penicillin g benzathine (BICILLIN LA) 1200000 UNIT/2ML injection 1.2 Million Units (has no administration in time range)  dexamethasone (DECADRON) injection 10 mg (has no administration in time range)    ED Course  I have reviewed the triage vital signs and the nursing notes.  Pertinent labs & imaging results that were available during my care of the patient were reviewed by me and considered in my medical decision making (see chart for details).  Jeffrey Mccoy was evaluated in Emergency Department on 06/30/2020 for the symptoms described in the history of present illness. He/she was evaluated in the context of the global COVID-19 pandemic, which necessitated consideration that the patient might be at risk for infection with the SARS-CoV-2 virus that causes COVID-19.  Institutional protocols and algorithms that pertain to the evaluation of patients at risk for COVID-19 are in a state of rapid change based on information released by regulatory bodies including the CDC and federal and state organizations. These policies and algorithms were followed during the patient's care in the ED.    MDM Rules/Calculators/A&P                         Presents with complaint of sore throat.  Patient is nontoxic-appearing, vitals are within normal limits.  On exam patient with tonsillar swelling, erythema, & exudates with posterior oropharynx erythema, fairly symmetric appearing. Strep test is positive.  Treated in the emergency  department with IM Decadron and IM Bicillin.  Exam does not seem consistent with PTA or RPA, there is no trismus, uvular deviation, or hot potato voice. Patient is tolerating own secretions without difficulty, full ROM of the neck, submandibular compartment is soft. Recommended use of Tylenol and Ibuprofen for any continued discomfort or fevers. I discussed results, treatment plan, need for PCP follow-up, and return precautions with the patient. Provided opportunity for questions, patient confirmed understanding and is in agreement with plan.   Final Clinical Impression(s) / ED Diagnoses Final diagnoses:  Strep throat    Rx / DC Orders ED Discharge Orders    None       Cherly Anderson, PA-C 06/30/20 1600    Sabas Sous, MD 06/30/20 1742

## 2020-06-30 NOTE — Discharge Instructions (Addendum)

## 2020-06-30 NOTE — ED Notes (Signed)
Patient verbalizes understanding of discharge instructions . Opportunity for questions and answers were provided . Armband removed by staff ,Pt discharged from ED. W/C  offered at D/C  and Declined W/C at D/C and was escorted to lobby by RN.  

## 2020-10-27 IMAGING — DX DG CHEST 2V
2 series · 2 of 2 positions shown · non-contrast
Comparison: None.

CLINICAL DATA: Fever and chest congestion.

EXAM:
CHEST - 2 VIEW

[w chest pa]
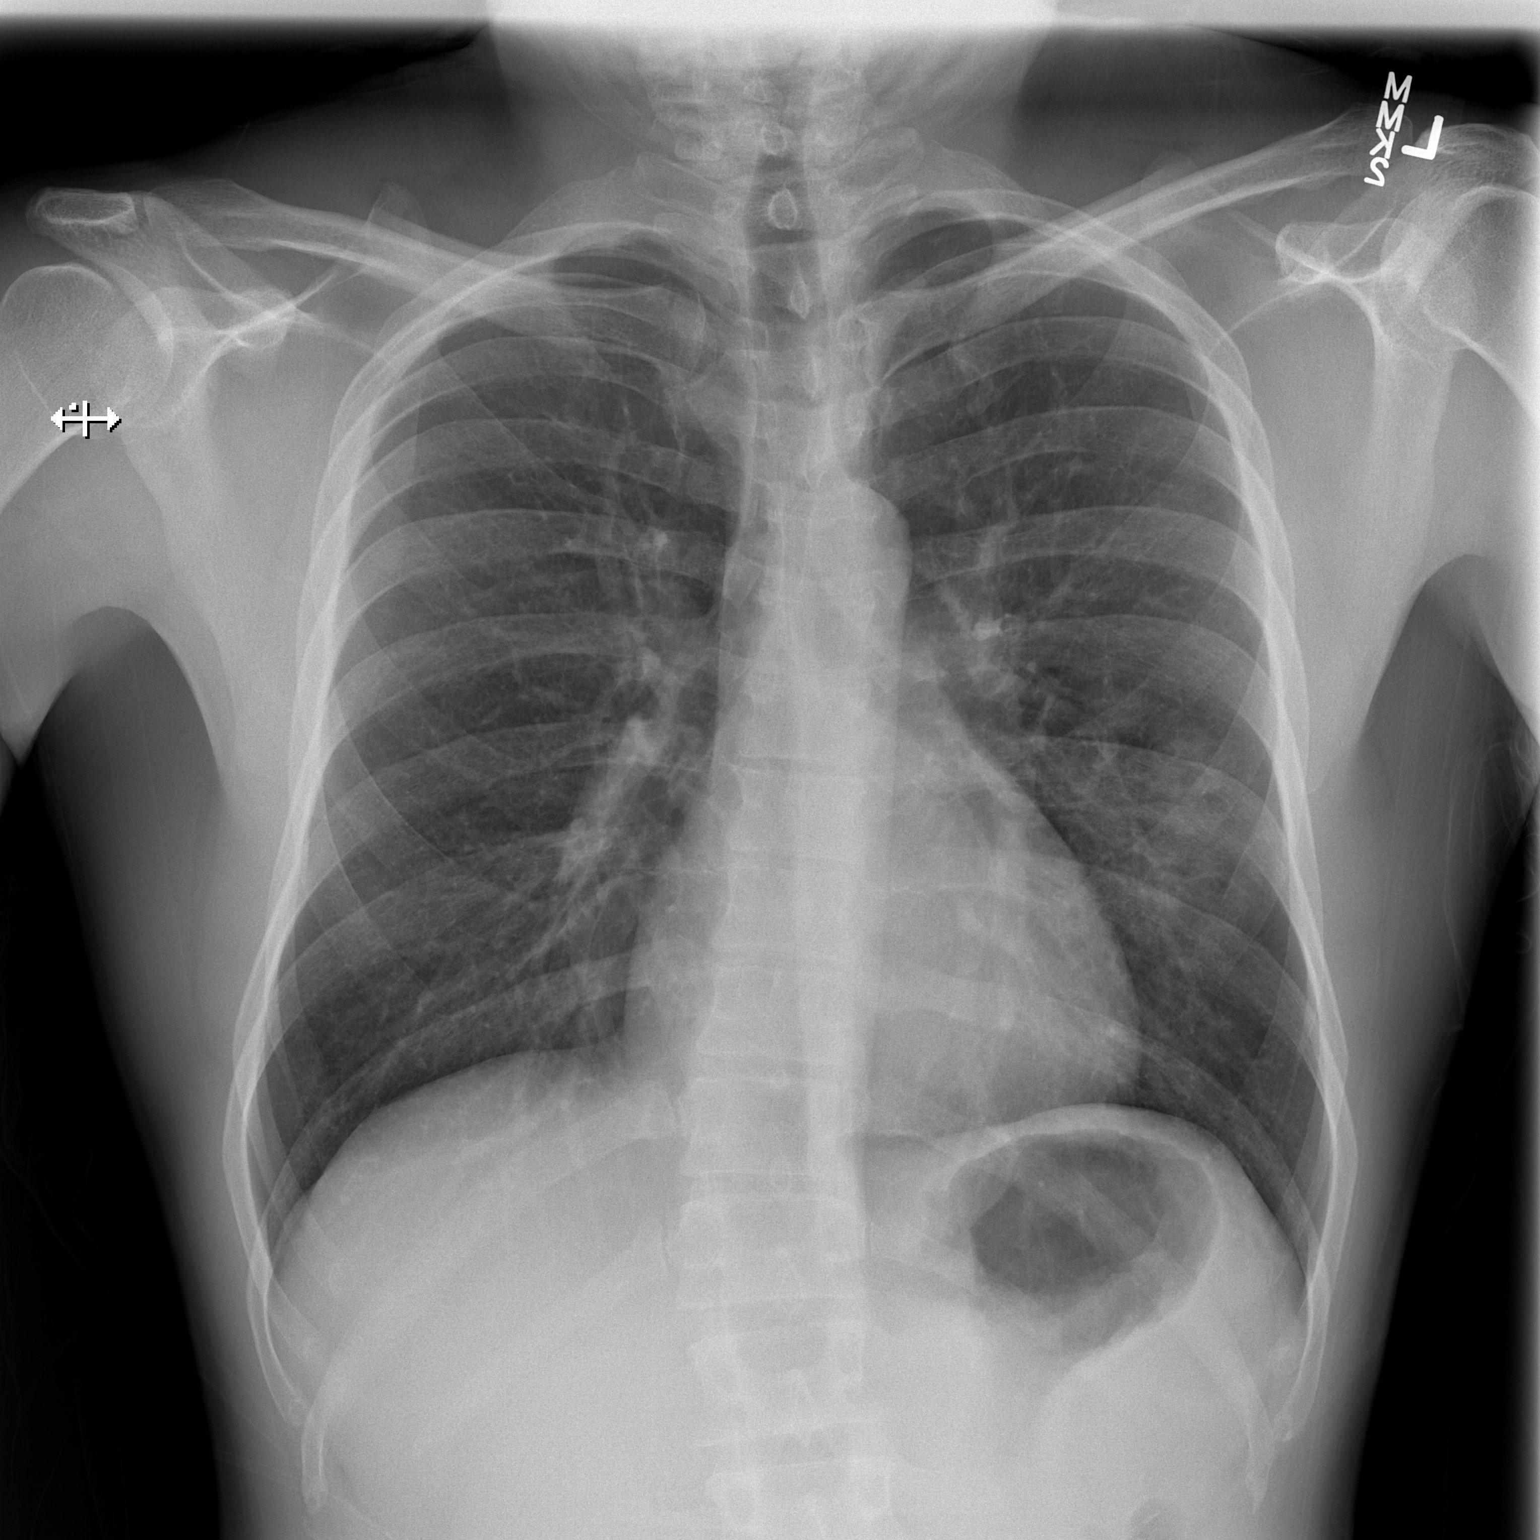

[w chest lat]
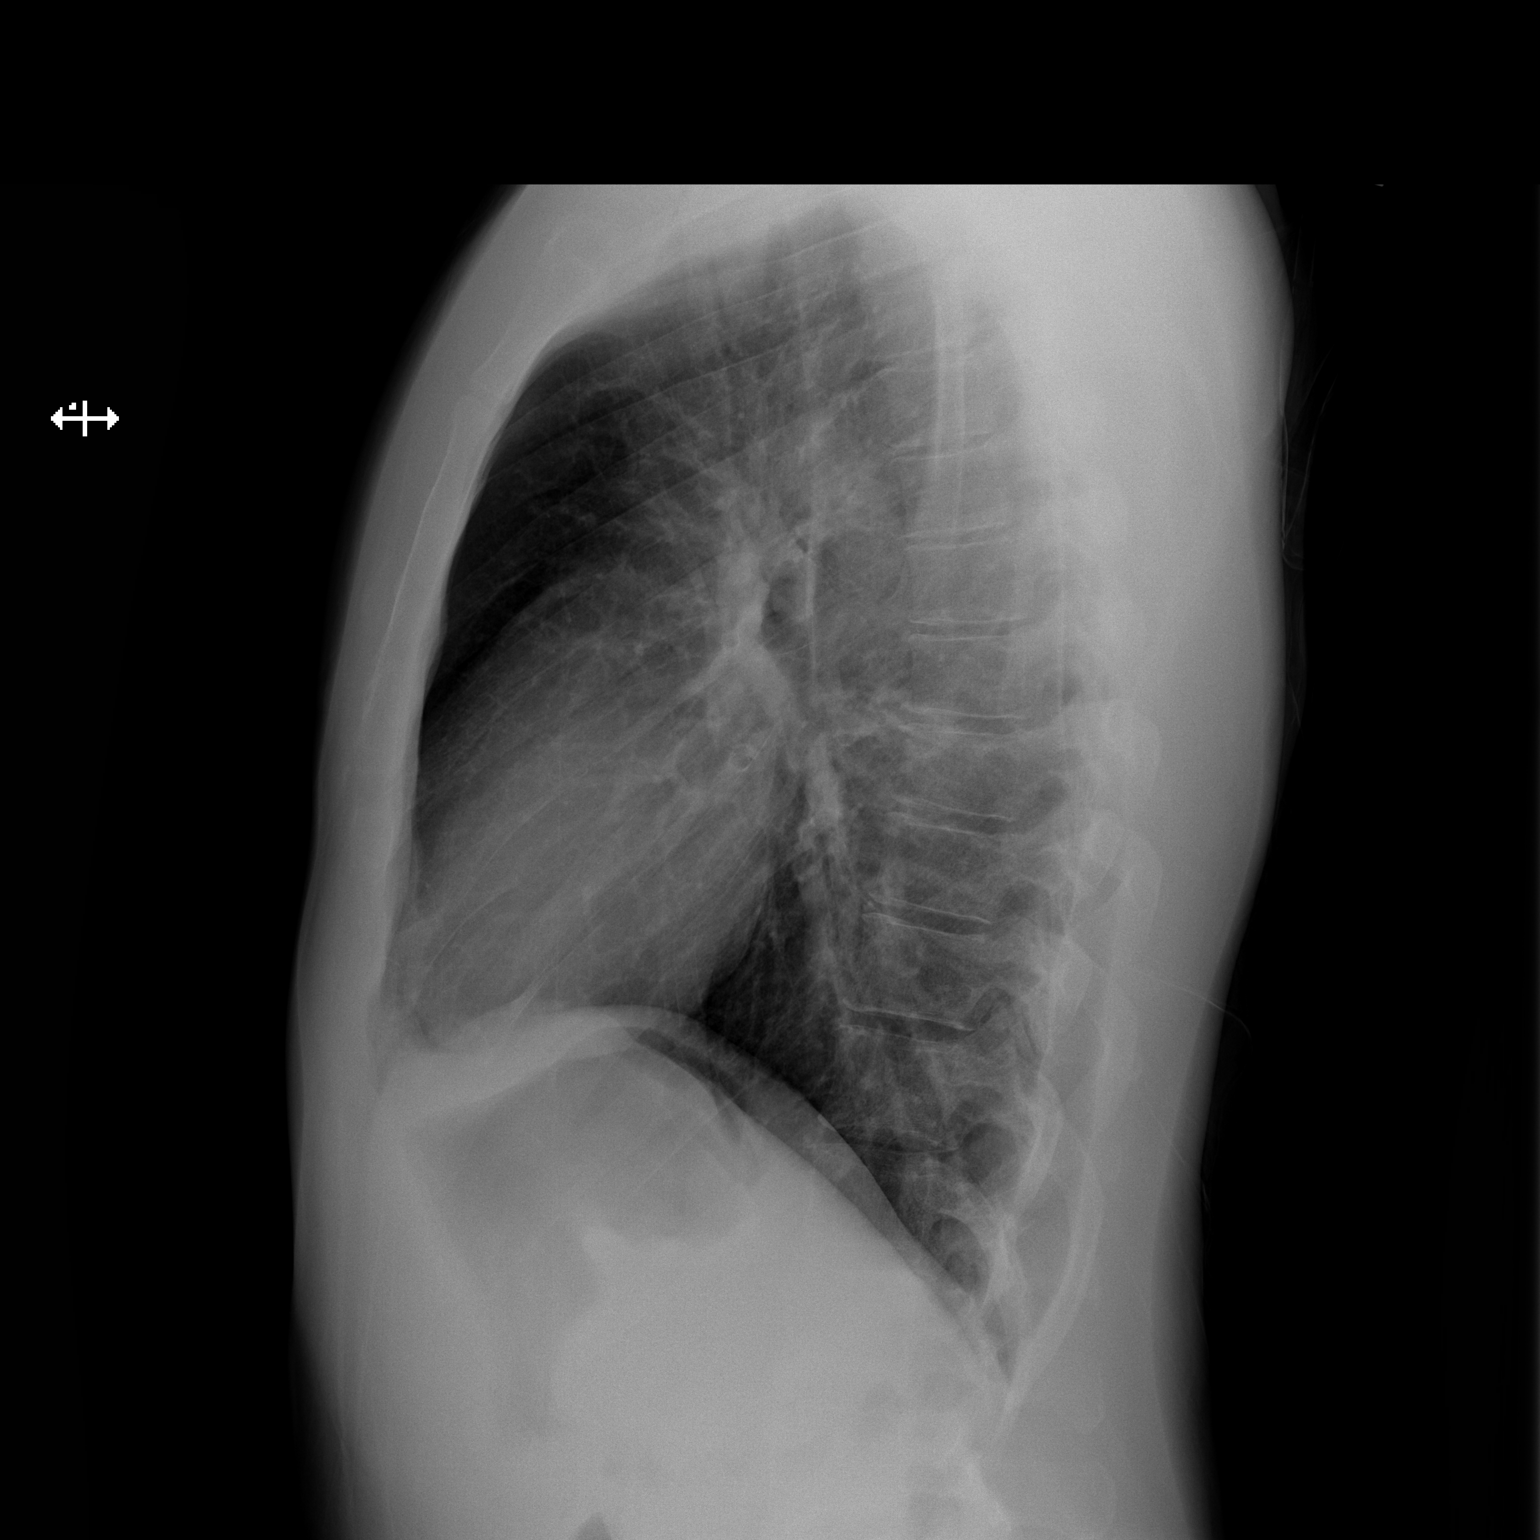

[2 of 2 positions shown; findings below may reference images not displayed]

FINDINGS: The heart size and mediastinal contours are within normal limits.
There may be a subtle patchy infiltrate in the left perihilar lung.
This is only seen in the frontal projection. There is no evidence of
pulmonary edema, pneumothorax, nodule or pleural fluid. the
visualized skeletal structures are unremarkable.
IMPRESSION: Possible subtle patchy early pneumonia in the left perihilar lung.

## 2021-06-09 ENCOUNTER — Emergency Department (HOSPITAL_COMMUNITY)
Admission: EM | Admit: 2021-06-09 | Discharge: 2021-06-10 | Disposition: A | Discharge status: Home / Self Care | Payer: Self-pay | Attending: Emergency Medicine | Admitting: Emergency Medicine

## 2021-06-09 ENCOUNTER — Other Ambulatory Visit: Payer: Self-pay

## 2021-06-09 DIAGNOSIS — R198 Other specified symptoms and signs involving the digestive system and abdomen: Secondary | ICD-10-CM | POA: Insufficient documentation

## 2021-06-09 DIAGNOSIS — B353 Tinea pedis: Secondary | ICD-10-CM | POA: Insufficient documentation

## 2021-06-09 DIAGNOSIS — F17210 Nicotine dependence, cigarettes, uncomplicated: Secondary | ICD-10-CM | POA: Insufficient documentation

## 2021-06-09 DIAGNOSIS — J020 Streptococcal pharyngitis: Secondary | ICD-10-CM | POA: Insufficient documentation

## 2021-06-09 DIAGNOSIS — K1379 Other lesions of oral mucosa: Secondary | ICD-10-CM | POA: Insufficient documentation

## 2021-06-09 LAB — GROUP A STREP BY PCR: Group A Strep by PCR: DETECTED — AB

## 2021-06-09 NOTE — ED Provider Notes (Signed)
Emergency Medicine Provider Triage Evaluation Note  Jeffrey Mccoy , a 35 y.o. male  was evaluated in triage.  Pt complains of hoarseness x1 month and painful rash involving right foot, worsening over 10 days.  Patient is a [employer] representative.  He also smokes marijuana.  Denies any tobacco use.  Denies any significant GERD.  He states that he lost his voice roughly 1 month ago and his symptoms have not improved.  It was initially sore, no recent sore throat symptoms.  Denies any fevers or chills.  He also noticed changes to the bottom of his tongue.  As for his right foot, he has noticed a rash that is worsening.  He wears socks most days and admits that he often stays moist.  It looks as though the rash is spreading from between the toes to the plantar aspect of his foot.  He denies any significant left foot involvement, but there are mild signs.  Patient was seen 06/2020 for sore throat symptoms and tested positive for group A strep by PCR.  Treated with Bicillin and Decadron injection.  Review of Systems  Positive: Hoarse voice, foot rash Negative: Fevers, injury, sore throat  Physical Exam  BP 129/77 (BP Location: Left Arm)   Pulse 67   Temp 98.4 F (36.9 C) (Oral)   Resp 15   SpO2 99%  Gen:   Awake, no distress   Resp:  Normal effort  MSK:   Moves extremities without difficulty  Other:  Oropharynx: Patent oropharynx.  Tonsillar hypertrophy.  Whitish plaque on left tonsil.  Appears different from tonsillar stone or exudate.  No uvular deviation.  No trismus.  Tolerating secretions well.  There is also findings suggestive of geographic tongue, particularly on the underside. Right foot: Red, scaly rash on plantar aspect of foot and worse between toes consistent with tinea pedis.  Medical Decision Making  Medically screening exam initiated at 9:18 PM.  Appropriate orders placed.  Gevena Mart was informed that the remainder of the evaluation will be completed by another provider,  this initial triage assessment does not replace that evaluation, and the importance of remaining in the ED until their evaluation is complete.  Patient with laryngitis.  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  Warm tea with honey.  Suspect he would benefit from referral to ENT given plaque on left tonsil that is curious. Particularly if left tonsil fails to improve with conservative therapy.   As for the rash on foot, consistent with tinea pedis.   I offered to dispo him from triage, but he would prefer to wait for Group A Strep by PCR rather than conservative approach to his laryngitis.    Lorelee New, PA-C 06/09/21 2118    Virgina Norfolk, DO 06/09/21 2206

## 2021-06-09 NOTE — ED Triage Notes (Signed)
Pt c/o R foot swelling/pain x1.5wk, hoarseness x44mo, states it is getting better. Ambulatory in triage, states 6/10 pain when walking. Denies taking medication for both complaints, states nothing has made either complaint better or worse. States that skin has split between toes & is trying to heal but not healing properly

## 2021-06-10 ENCOUNTER — Other Ambulatory Visit: Payer: Self-pay

## 2021-06-10 LAB — CBC WITH DIFFERENTIAL/PLATELET
Abs Immature Granulocytes: 0.02 10*3/uL (ref 0.00–0.07)
Basophils Absolute: 0 10*3/uL (ref 0.0–0.1)
Basophils Relative: 0 %
Eosinophils Absolute: 0.1 10*3/uL (ref 0.0–0.5)
Eosinophils Relative: 2 %
HCT: 41.2 % (ref 39.0–52.0)
Hemoglobin: 12.8 g/dL — ABNORMAL LOW (ref 13.0–17.0)
Immature Granulocytes: 0 %
Lymphocytes Relative: 44 %
Lymphs Abs: 2.6 10*3/uL (ref 0.7–4.0)
MCH: 26.4 pg (ref 26.0–34.0)
MCHC: 31.1 g/dL (ref 30.0–36.0)
MCV: 84.9 fL (ref 80.0–100.0)
Monocytes Absolute: 0.8 10*3/uL (ref 0.1–1.0)
Monocytes Relative: 12 %
Neutro Abs: 2.6 10*3/uL (ref 1.7–7.7)
Neutrophils Relative %: 42 %
Platelets: 247 10*3/uL (ref 150–400)
RBC: 4.85 MIL/uL (ref 4.22–5.81)
RDW: 14.9 % (ref 11.5–15.5)
WBC: 6.2 10*3/uL (ref 4.0–10.5)
nRBC: 0 % (ref 0.0–0.2)

## 2021-06-10 LAB — RAPID HIV SCREEN (HIV 1/2 AB+AG)
HIV 1/2 Antibodies: NONREACTIVE
HIV-1 P24 Antigen - HIV24: NONREACTIVE

## 2021-06-10 LAB — COMPREHENSIVE METABOLIC PANEL
ALT: 26 U/L (ref 0–44)
AST: 27 U/L (ref 15–41)
Albumin: 3.4 g/dL — ABNORMAL LOW (ref 3.5–5.0)
Alkaline Phosphatase: 58 U/L (ref 38–126)
Anion gap: 5 (ref 5–15)
BUN: 11 mg/dL (ref 6–20)
CO2: 27 mmol/L (ref 22–32)
Calcium: 9 mg/dL (ref 8.9–10.3)
Chloride: 105 mmol/L (ref 98–111)
Creatinine, Ser: 1.02 mg/dL (ref 0.61–1.24)
GFR, Estimated: >60 mL/min (ref >60)
Glucose, Bld: 99 mg/dL (ref 70–99)
Potassium: 4.1 mmol/L (ref 3.5–5.1)
Sodium: 137 mmol/L (ref 135–145)
Total Bilirubin: <0.1 mg/dL — ABNORMAL LOW (ref 0.3–1.2)
Total Protein: 7.6 g/dL (ref 6.5–8.1)

## 2021-06-10 MED ORDER — AZITHROMYCIN 250 MG PO TABS
1000.0000 mg | ORAL_TABLET | Freq: Once | ORAL | Status: AC
  Administered 2021-06-10: 1000 mg via ORAL
  Filled 2021-06-10: qty 4

## 2021-06-10 MED ORDER — CHLORHEXIDINE GLUCONATE 0.12 % MT SOLN: 15.0000 mL | Freq: Two times a day (BID) | OROMUCOSAL | 0 refills | Status: AC

## 2021-06-10 MED ORDER — CLINDAMYCIN HCL 150 MG PO CAPS
450.0000 mg | ORAL_CAPSULE | Freq: Once | ORAL | Status: AC
  Administered 2021-06-10: 450 mg via ORAL
  Filled 2021-06-10: qty 3

## 2021-06-10 MED ORDER — CLOTRIMAZOLE 1 % EX CREA: TOPICAL_CREAM | CUTANEOUS | 0 refills | Status: DC

## 2021-06-10 MED ORDER — CEFTRIAXONE SODIUM 500 MG IJ SOLR
500.0000 mg | Freq: Once | INTRAMUSCULAR | Status: AC
  Administered 2021-06-10: 500 mg via INTRAMUSCULAR
  Filled 2021-06-10: qty 500

## 2021-06-10 MED ORDER — CLINDAMYCIN HCL 150 MG PO CAPS: 450.0000 mg | ORAL_CAPSULE | Freq: Three times a day (TID) | ORAL | 0 refills | Status: AC

## 2021-06-10 MED ORDER — LIDOCAINE HCL (PF) 1 % IJ SOLN
INTRAMUSCULAR | Status: AC
  Filled 2021-06-10: qty 5
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
## 2021-06-12 LAB — GC/CHLAMYDIA PROBE AMP (~~LOC~~) NOT AT ARMC
Chlamydia: NEGATIVE
Comment: NEGATIVE
Comment: NORMAL
Neisseria Gonorrhea: NEGATIVE

## 2021-06-26 ENCOUNTER — Ambulatory Visit: Payer: Self-pay | Admitting: Internal Medicine

## 2022-03-04 ENCOUNTER — Encounter (HOSPITAL_COMMUNITY): Payer: Self-pay | Admitting: Emergency Medicine

## 2022-03-04 ENCOUNTER — Emergency Department (HOSPITAL_COMMUNITY)
Admission: EM | Admit: 2022-03-04 | Discharge: 2022-03-05 | Disposition: A | Discharge status: Home / Self Care | Payer: Self-pay | Attending: Emergency Medicine | Admitting: Emergency Medicine

## 2022-03-04 ENCOUNTER — Other Ambulatory Visit: Payer: Self-pay

## 2022-03-04 DIAGNOSIS — D72829 Elevated white blood cell count, unspecified: Secondary | ICD-10-CM | POA: Insufficient documentation

## 2022-03-04 DIAGNOSIS — J36 Peritonsillar abscess: Secondary | ICD-10-CM | POA: Insufficient documentation

## 2022-03-04 NOTE — ED Triage Notes (Signed)
Pt reported to ED with c/o severe throat and left ear pain. Pt states symptoms have been ongoing x4 days and he has tried at home remedies no relief in symptoms. Also endorses nasal drip.  ?

## 2022-03-04 NOTE — ED Provider Triage Note (Signed)
Emergency Medicine Provider Triage Evaluation Note ? ?Jeffrey Mccoy , a 36 y.o. male  was evaluated in triage.  Pt complains of right sided sore throat for four days.  He is unsure if he has a fever stating that he has felt hot and unwell.  He reports significant pain with swallowing however is able to swallow his saliva. ? ? ?Physical Exam  ?BP 126/84 (BP Location: Right Arm)   Pulse 71   Temp 99.9 ?F (37.7 ?C) (Oral)   Resp 18   SpO2 95%  ?Gen:   Awake, no distress  \ ?Resp:  Normal effort  ?MSK:   Moves extremities without difficulty  ?Other:  Mild trismus.  There is significant uvular deviation from a large area of edema on the left-sided tonsil.  Right-sided tonsil is minimally enlarged. ? ?Medical Decision Making  ?Medically screening exam initiated at 11:37 PM.  Appropriate orders placed.  Jeffrey Mccoy was informed that the remainder of the evaluation will be completed by another provider, this initial triage assessment does not replace that evaluation, and the importance of remaining in the ED until their evaluation is complete. ? ?Clinically I am concerned that patient may have a left-sided peritonsillar abscess.  He does have trismus and significant uvular deviation.  Will order labs, CT scan to evaluate for drainable fluid collection. ?  ?Cristina Gong, PA-C ?03/04/22 2338 ? ?

## 2022-03-05 ENCOUNTER — Emergency Department (HOSPITAL_COMMUNITY): Payer: Self-pay

## 2022-03-05 LAB — CBC WITH DIFFERENTIAL/PLATELET
Abs Immature Granulocytes: 0.04 10*3/uL (ref 0.00–0.07)
Basophils Absolute: 0 10*3/uL (ref 0.0–0.1)
Basophils Relative: 0 %
Eosinophils Absolute: 0 10*3/uL (ref 0.0–0.5)
Eosinophils Relative: 0 %
HCT: 37.8 % — ABNORMAL LOW (ref 39.0–52.0)
Hemoglobin: 12.2 g/dL — ABNORMAL LOW (ref 13.0–17.0)
Immature Granulocytes: 0 %
Lymphocytes Relative: 16 %
Lymphs Abs: 1.9 10*3/uL (ref 0.7–4.0)
MCH: 27.2 pg (ref 26.0–34.0)
MCHC: 32.3 g/dL (ref 30.0–36.0)
MCV: 84.2 fL (ref 80.0–100.0)
Monocytes Absolute: 1.5 10*3/uL — ABNORMAL HIGH (ref 0.1–1.0)
Monocytes Relative: 12 %
Neutro Abs: 8.8 10*3/uL — ABNORMAL HIGH (ref 1.7–7.7)
Neutrophils Relative %: 72 %
Platelets: 206 10*3/uL (ref 150–400)
RBC: 4.49 MIL/uL (ref 4.22–5.81)
RDW: 13.6 % (ref 11.5–15.5)
WBC: 12.3 10*3/uL — ABNORMAL HIGH (ref 4.0–10.5)
nRBC: 0 % (ref 0.0–0.2)

## 2022-03-05 LAB — BASIC METABOLIC PANEL
Anion gap: 11 (ref 5–15)
BUN: 7 mg/dL (ref 6–20)
CO2: 24 mmol/L (ref 22–32)
Calcium: 8.6 mg/dL — ABNORMAL LOW (ref 8.9–10.3)
Chloride: 99 mmol/L (ref 98–111)
Creatinine, Ser: 1.03 mg/dL (ref 0.61–1.24)
GFR, Estimated: >60 mL/min (ref >60)
Glucose, Bld: 109 mg/dL — ABNORMAL HIGH (ref 70–99)
Potassium: 3.5 mmol/L (ref 3.5–5.1)
Sodium: 134 mmol/L — ABNORMAL LOW (ref 135–145)

## 2022-03-05 LAB — GROUP A STREP BY PCR: Group A Strep by PCR: DETECTED — AB

## 2022-03-05 MED ORDER — SODIUM CHLORIDE 0.9 % IV BOLUS
1000.0000 mL | Freq: Once | INTRAVENOUS | Status: AC
  Administered 2022-03-05: 1000 mL via INTRAVENOUS

## 2022-03-05 MED ORDER — CLINDAMYCIN HCL 300 MG PO CAPS: 300.0000 mg | ORAL_CAPSULE | Freq: Four times a day (QID) | ORAL | 0 refills | Status: AC

## 2022-03-05 MED ORDER — CLINDAMYCIN PHOSPHATE 600 MG/50ML IV SOLN
600.0000 mg | Freq: Once | INTRAVENOUS | Status: AC
  Administered 2022-03-05: 600 mg via INTRAVENOUS
  Filled 2022-03-05: qty 50

## 2022-03-05 MED ORDER — FENTANYL CITRATE PF 50 MCG/ML IJ SOSY
50.0000 ug | PREFILLED_SYRINGE | Freq: Once | INTRAMUSCULAR | Status: AC
  Administered 2022-03-05: 50 ug via INTRAVENOUS
  Filled 2022-03-05: qty 1

## 2022-03-05 MED ORDER — DEXAMETHASONE SODIUM PHOSPHATE 10 MG/ML IJ SOLN
10.0000 mg | Freq: Once | INTRAMUSCULAR | Status: AC
  Administered 2022-03-05: 10 mg via INTRAVENOUS
  Filled 2022-03-05: qty 1

## 2022-03-05 MED ORDER — CLINDAMYCIN PHOSPHATE 300 MG/50ML IV SOLN
300.0000 mg | Freq: Once | INTRAVENOUS | Status: AC
  Administered 2022-03-05: 300 mg via INTRAVENOUS
  Filled 2022-03-05: qty 50

## 2022-03-05 MED ORDER — IOHEXOL 300 MG/ML  SOLN
75.0000 mL | Freq: Once | INTRAMUSCULAR | Status: AC | PRN
  Administered 2022-03-05: 75 mL via INTRAVENOUS

## 2022-03-05 NOTE — ED Provider Notes (Signed)
?[facility] ?Provider Note ? ? ?CSN: [id number] ?Arrival date & time: 03/04/22  2257 ? ?  ? ?History ? ?Chief Complaint  ?Patient presents with  ? Sore Throat  ? Otalgia  ? ? ?Gevena MartCharles D Frye is a 36 y.o. male. ? ?The history is provided by the patient and medical records.  ?Sore Throat ? ?Otalgia ?Associated symptoms: sore throat   ? ?36 y.o. M here with sore throat.  States ongoing for about 4 days now, steadily worsening.  Over the past 48 hours has had increased difficulty swallowing, especially when lying flat to sleep.  Reports some chills, unsure about fever.  No sick contacts.  Hx of strep in the past but never this bad.  Took some leftover penicillin he had at home without change.  No other meds taken PTA. ? ?Home Medications ?Prior to Admission medications   ?Medication Sig Start Date End Date Taking? Authorizing Provider  ?acetaminophen (TYLENOL) 325 MG tablet Take 650 mg by mouth every 6 (six) hours as needed.    [provider]  ?clotrimazole (LOTRIMIN) 1 % cream Apply to affected area (feet and navel) 2 times daily. Make sure area has been cleaned and patted dry prior to application. 06/10/21   Nira Connardama, Pedro Eduardo, MD  ?oxyCODONE-acetaminophen [facility](PERCOCET) 5-325 MG tablet 1-2 tabs po q6 hours prn pain 05/31/16   Betha LoaKuzma, Kevin, MD  ?   ? ?Allergies    ?Patient has no known allergies.   ? ?Review of Systems   ?Review of Systems  ?HENT:  Positive for ear pain and sore throat.   ?All other systems reviewed and are negative. ? ?Physical Exam ?Updated Vital Signs ?BP 126/84 (BP Location: Right Arm)   Pulse 71   Temp 99.9 ?F (37.7 ?C) (Oral)   Resp 18   SpO2 95%  ? ?Physical Exam ?Vitals and nursing note reviewed.  ?Constitutional:   ?   Appearance: He is well-developed.  ?HENT:  ?   Head: Normocephalic and atraumatic.  ?   Mouth/Throat:  ?   Comments: Apparent left sided PTA, uvula deviated to the right, hot potato voice noted on exam, able to swallow but  painful when doing so ?Eyes:  ?   Conjunctiva/sclera: Conjunctivae normal.  ?   Pupils: Pupils are equal, round, and reactive to light.  ?Cardiovascular:  ?   Rate and Rhythm: Normal rate and regular rhythm.  ?   Heart sounds: Normal heart sounds.  ?Pulmonary:  ?   Effort: Pulmonary effort is normal.  ?   Breath sounds: Normal breath sounds.  ?Abdominal:  ?   General: Bowel sounds are normal.  ?   Palpations: Abdomen is soft.  ?Musculoskeletal:     ?   General: Normal range of motion.  ?   Cervical back: Normal range of motion.  ?Skin: ?   General: Skin is warm and dry.  ?Neurological:  ?   Mental Status: He is alert and oriented to person, place, and time.  ? ? ?ED Results / Procedures / Treatments   ?Labs ?(all labs ordered are listed, but only abnormal results are displayed) ?Labs Reviewed  ?GROUP A STREP BY PCR - Abnormal; Notable for the following components:  ?    Result Value  ? Group A Strep by PCR DETECTED (*)   ? All other components within normal limits  ?CBC WITH DIFFERENTIAL/PLATELET - Abnormal; Notable for the following components:  ? WBC 12.3 (*)   ? Hemoglobin 12.2 (*)   ?  HCT 37.8 (*)   ? Neutro Abs 8.8 (*)   ? Monocytes Absolute 1.5 (*)   ? All other components within normal limits  ?BASIC METABOLIC PANEL - Abnormal; Notable for the following components:  ? Sodium 134 (*)   ? Glucose, Bld 109 (*)   ? Calcium 8.6 (*)   ? All other components within normal limits  ? ? ?EKG ?None ? ?Radiology ?CT Soft Tissue Neck W Contrast ? ?Result Date: 03/05/2022 ?CLINICAL DATA:  Sore throat EXAM: CT NECK WITH CONTRAST TECHNIQUE: Multidetector CT imaging of the neck was performed using the standard protocol following the bolus administration of intravenous contrast. RADIATION DOSE REDUCTION: This exam was performed according to the departmental dose-optimization program which includes automated exposure control, adjustment of the mA and/or kV according to patient size and/or use of iterative reconstruction  technique. CONTRAST:  57mL OMNIPAQUE IOHEXOL 300 MG/ML  SOLN COMPARISON:  None. FINDINGS: PHARYNX AND LARYNX: Enlarged adenoid, palatine and lingual tonsils. There is a large left peritonsillar collection that measures 3.2 x 2.5 cm. Density of the central contents is greater than simple fluid, which may be due to purulence. There is a retropharyngeal effusion that is 5 mm thick, but there is no retropharyngeal abscess. SALIVARY GLANDS: Normal parotid, submandibular and sublingual glands. THYROID: Normal. LYMPH NODES: Reactive left cervical lymphadenopathy. VASCULAR: Major cervical vessels are patent. LIMITED INTRACRANIAL: Normal. VISUALIZED ORBITS: Normal. MASTOIDS AND VISUALIZED PARANASAL SINUSES: No fluid levels or advanced mucosal thickening. No mastoid effusion. SKELETON: No bony spinal canal stenosis. No lytic or blastic lesions. UPPER CHEST: Clear. OTHER: None. IMPRESSION: 1. Acute tonsillopharyngitis with large left peritonsillar abscess, measuring up to 3.2 x 2.5 cm. 2. Retropharyngeal effusion without retropharyngeal abscess. 3. Reactive left cervical lymphadenopathy. Electronically Signed   By: Deatra Robinson M.D.   On: 03/05/2022 01:36   ? ?Procedures ?Procedures  ? ? ?Medications Ordered in ED ?Medications  ?clindamycin (CLEOCIN) IVPB 600 mg (0 mg Intravenous Stopped 03/05/22 0140)  ?dexamethasone (DECADRON) injection 10 mg (10 mg Intravenous Given 03/05/22 0055)  ?fentaNYL (SUBLIMAZE) injection 50 mcg (50 mcg Intravenous Given 03/05/22 0055)  ?sodium chloride 0.9 % bolus 1,000 mL (0 mLs Intravenous Stopped 03/05/22 0230)  ?iohexol (OMNIPAQUE) 300 MG/ML solution 75 mL (75 mLs Intravenous Contrast Given 03/05/22 0119)  ?dexamethasone (DECADRON) injection 10 mg (10 mg Intravenous Given 03/05/22 0220)  ?clindamycin (CLEOCIN) IVPB 300 mg (0 mg Intravenous Stopped 03/05/22 0313)  ? ? ?ED Course/ Medical Decision Making/ A&P ?  ?                        ?Medical Decision Making ?Risk ?Prescription drug  management. ? ? ?36 y.o. M here with ongoing sore throat for about 4 days now but worsening.  More trouble swallowing over the past 48 hours.   Afebrile, non-toxic but does have muffled voice.  Apparent left sided PTA on exam, uvula significantly deviated to right.  He is able to swallow but pain elicited with doing so.  Labs pending along with CT soft tissue neck.  Given IV decadron and clindamycin. ? ?Labs as above-- leukocytosis.  Rapid strep is +.  CT soft tissue neck with large left sided PTA measuring 3.2 x 2.5.  will discuss with ENT. ? ?1:55 AM ?Discussed with Dr. Suszanne Conners-- recommends to give another 10mg  IV decadron and additional 300mg  IV clindamycin.  He will come in this AM for bedside drainage.  ? ?Patient has been updated with plan as above,  he is agreeable. ? ?6:25 AM ?Awaiting drainage by ENT.  Care signed out to oncoming provider.  Plan to discharge with instructions as per Dr. Suszanne Conners. ? ?Final Clinical Impression(s) / ED Diagnoses ?Final diagnoses:  ?Peritonsillar abscess  ? ? ?Rx / DC Orders ?ED Discharge Orders   ? ? None  ? ?  ? ? ?  ?Garlon Hatchet, PA-C ?03/05/22 [phone] ? ?  ?Dione Booze, MD ?03/05/22 0725 ? ?

## 2022-03-05 NOTE — Consult Note (Signed)
Reason for Consult: Left peritonsillar abscess ? ?HPI:  ?Jeffrey Mccoy is an 36 y.o. male who presents to the [facility] emergency room complaining of severe left-sided sore throat.  The sore throat has been progressively worsened over the past 4 days.  He now complains of severe dysphagia, odynophagia, fever, and chills.  He has a history of. previous strep infections.  He has no previous peritonsillar abscess.  His CT scan shows a 3 cm left peritonsillar abscess. ? ?Past Medical History:  ?Diagnosis Date  ? Incarceration 05/24/2016  ? [place]. [place]; he will be accompanied by deputies DOS  ? Metacarpal bone fracture 05/12/2016  ? right small  ? ? ?Past Surgical History:  ?Procedure Laterality Date  ? NO PAST SURGERIES    ? OPEN REDUCTION INTERNAL FIXATION (ORIF) FINGER WITH RADIAL BONE GRAFT Right 05/31/2016  ? Procedure: RIGHT SMALL METACARPAL FRACTURE OPEN REDUCTION INTERNAL FIXATION (ORIF) DISTAL RADIUS BONE GRAFT ;  Surgeon: Betha LoaKevin Kuzma, MD;  Location: [facility];  Service: Orthopedics;  Laterality: Right;  ? ? ?History reviewed. No pertinent family history. ? ?Social History:  reports that he has been smoking cigarettes. He has a 7.50 pack-year smoking history. He has never used smokeless tobacco. He reports current alcohol use. He reports that he does not use drugs. ? ?Allergies: No Known Allergies ? ?Prior to Admission medications   ?Medication Sig Start Date End Date Taking? Authorizing Provider  ?acetaminophen (TYLENOL) 500 MG tablet Take 1,000-1,500 mg by mouth every 6 (six) hours as needed for headache or moderate pain.   Yes [provider]  ?clindamycin (CLEOCIN) 300 MG capsule Take 1 capsule (300 mg total) by mouth 4 (four) times daily. 03/05/22  Yes Theron AristaSage, Haley, PA-C  ? ? ?Results for orders placed or performed during the hospital encounter of 03/04/22 (from the past 48 hour(s))  ?Group A Strep by PCR     Status: Abnormal  ? Collection Time:  03/04/22 11:40 PM  ? Specimen: Throat; Sterile Swab  ?Result Value Ref Range  ? Group A Strep by PCR DETECTED (A) NOT DETECTED  ?  Comment: Performed at [facility], [address] [place], [place] [address]  ?CBC with Differential     Status: Abnormal  ? Collection Time: 03/04/22 11:49 PM  ?Result Value Ref Range  ? WBC 12.3 (H) 4.0 - 10.5 K/uL  ? RBC 4.49 4.22 - 5.81 MIL/uL  ? Hemoglobin 12.2 (L) 13.0 - 17.0 g/dL  ? HCT 37.8 (L) 39.0 - 52.0 %  ? MCV 84.2 80.0 - 100.0 fL  ? MCH 27.2 26.0 - 34.0 pg  ? MCHC 32.3 30.0 - 36.0 g/dL  ? RDW 13.6 11.5 - 15.5 %  ? Platelets 206 150 - 400 K/uL  ? nRBC 0.0 0.0 - 0.2 %  ? Neutrophils Relative % 72 %  ? Neutro Abs 8.8 (H) 1.7 - 7.7 K/uL  ? Lymphocytes Relative 16 %  ? Lymphs Abs 1.9 0.7 - 4.0 K/uL  ? Monocytes Relative 12 %  ? Monocytes Absolute 1.5 (H) 0.1 - 1.0 K/uL  ? Eosinophils Relative 0 %  ? Eosinophils Absolute 0.0 0.0 - 0.5 K/uL  ? Basophils Relative 0 %  ? Basophils Absolute 0.0 0.0 - 0.1 K/uL  ? Immature Granulocytes 0 %  ? Abs Immature Granulocytes 0.04 0.00 - 0.07 K/uL  ?  Comment: Performed at [facility], [address] [place], [place] [address]  ?  Basic metabolic panel     Status: Abnormal  ? Collection Time: 03/04/22 11:49 PM  ?Result Value Ref Range  ? Sodium 134 (L) 135 - 145 mmol/L  ? Potassium 3.5 3.5 - 5.1 mmol/L  ? Chloride 99 98 - 111 mmol/L  ? CO2 24 22 - 32 mmol/L  ? Glucose, Bld 109 (H) 70 - 99 mg/dL  ?  Comment: Glucose reference range applies only to samples taken after fasting for at least 8 hours.  ? BUN 7 6 - 20 mg/dL  ? Creatinine, Ser 1.03 0.61 - 1.24 mg/dL  ? Calcium 8.6 (L) 8.9 - 10.3 mg/dL  ? GFR, Estimated >60 >60 mL/min  ?  Comment: (NOTE) ?Calculated using the CKD-EPI Creatinine Equation (2021) ?  ? Anion gap 11 5 - 15  ?  Comment: Performed at [facility], [address] [place], [place] [address]  ? ? ?CT Soft Tissue Neck W Contrast ? ?Result Date: 03/05/2022 ?CLINICAL DATA:  Sore throat EXAM: CT NECK WITH  CONTRAST TECHNIQUE: Multidetector CT imaging of the neck was performed using the standard protocol following the bolus administration of intravenous contrast. RADIATION DOSE REDUCTION: This exam was performed according to the departmental dose-optimization program which includes automated exposure control, adjustment of the mA and/or kV according to patient size and/or use of iterative reconstruction technique. CONTRAST:  61mL OMNIPAQUE IOHEXOL 300 MG/ML  SOLN COMPARISON:  None. FINDINGS: PHARYNX AND LARYNX: Enlarged adenoid, palatine and lingual tonsils. There is a large left peritonsillar collection that measures 3.2 x 2.5 cm. Density of the central contents is greater than simple fluid, which may be due to purulence. There is a retropharyngeal effusion that is 5 mm thick, but there is no retropharyngeal abscess. SALIVARY GLANDS: Normal parotid, submandibular and sublingual glands. THYROID: Normal. LYMPH NODES: Reactive left cervical lymphadenopathy. VASCULAR: Major cervical vessels are patent. LIMITED INTRACRANIAL: Normal. VISUALIZED ORBITS: Normal. MASTOIDS AND VISUALIZED PARANASAL SINUSES: No fluid levels or advanced mucosal thickening. No mastoid effusion. SKELETON: No bony spinal canal stenosis. No lytic or blastic lesions. UPPER CHEST: Clear. OTHER: None. IMPRESSION: 1. Acute tonsillopharyngitis with large left peritonsillar abscess, measuring up to 3.2 x 2.5 cm. 2. Retropharyngeal effusion without retropharyngeal abscess. 3. Reactive left cervical lymphadenopathy. Electronically Signed   By: Deatra Robinson M.D.   On: 03/05/2022 01:36   ? ?Review of Systems  ?HENT:  Positive for ear pain and sore throat.   ?All other systems reviewed and are negative. ? ?Blood pressure (!) 100/58, pulse 62, temperature 98.4 ?F (36.9 ?C), temperature source Oral, resp. rate 16, SpO2 98 %. ?General appearance: alert and cooperative ?Eyes: Pupils are equal, round, reactive to light. Extraocular motion is intact.  ?Ears:  Examination of the ears shows normal auricles and external auditory canals bilaterally.  ?Nose: Nasal examination shows normal mucosa, septum, turbinates.  ?Face: Facial examination shows no asymmetry. Palpation of the face elicit no significant tenderness.  ?Mouth: Oral cavity examination shows significant left peritonsillar edema and erythema. ?Neck: Palpation of the neck reveals no lymphadenopathy or mass. The trachea is midline.  ?Neuro: Cranial nerves 2-12 are all grossly in tact.  ? ?Procedure: Incision and drainage of the left peritonsillar abscess.   ?Anesthesia: local anesthesia with 1% lidocaine with epinephrine.   ?Description: The patient is placed upright on the hospital bed.  After adequate local anesthesia is achieved, an 18-G spinal needle is used to make multiple passes over the left superior tonsillar pole.  Approximately 3 cc of purulent fluid  was evacuated.  The abscess cavity was incised opened with the spinal needle tip.  The patient tolerated the procedure well.    ? ?Assessment/Plan: ?Left peritonsillar abscess. ?-Incision and drainage of the left peritonsillar abscess was performed in the emergency room without difficulty. ?-Oral clindamycin 300 mg 4 times daily for 10 days. ?-The patient may follow-up with me as an outpatient as needed. ? ?Zanae Kuehnle W Dequane Strahan ?03/05/2022, 7:17 AM   ?

## 2022-03-05 NOTE — ED Notes (Signed)
Patient verbalizes understanding of d/c instructions. Opportunities for questions and answers were provided. Pt d/c from ED and ambulated to lobby.  

## 2022-03-05 NOTE — Discharge Instructions (Addendum)
Take clindamycin 300 four times daily for the next 10 days. Follow up with ENT if needed ? ?

## 2022-03-05 NOTE — ED Provider Notes (Signed)
See Quincy Carnes note for complete history and physical exam.  In brief, patient presented due to sore throat and was found to have peritonsillar abscess.  This was drained by Dr. Lorelee Cover with ENT this morning after shift change. He advises starting the patient on clindamycin 304 times daily and following up with him in the office as needed.  Patient was discharged in stable condition. ?  Sherrill Raring, PA-C ?03/05/22 [id number] ? ?  ?Delora Fuel, MD ?[phone] ? ?

## 2022-03-05 NOTE — ED Notes (Signed)
ENT MD at bedside.
